# Patient Record
Sex: Female | Born: 1945 | Race: Black or African American | Hispanic: No | Smoking: Current every day smoker
Health system: Southern US, Community
[De-identification: ages and names within clinical notes are randomized; demographics above are authoritative.]

## PROBLEM LIST (undated history)

## (undated) DIAGNOSIS — B019 Varicella without complication: Secondary | ICD-10-CM

## (undated) DIAGNOSIS — K859 Acute pancreatitis without necrosis or infection, unspecified: Secondary | ICD-10-CM

## (undated) DIAGNOSIS — E785 Hyperlipidemia, unspecified: Secondary | ICD-10-CM

## (undated) DIAGNOSIS — K219 Gastro-esophageal reflux disease without esophagitis: Secondary | ICD-10-CM

## (undated) DIAGNOSIS — I10 Essential (primary) hypertension: Secondary | ICD-10-CM

## (undated) DIAGNOSIS — N200 Calculus of kidney: Secondary | ICD-10-CM

## (undated) DIAGNOSIS — G43909 Migraine, unspecified, not intractable, without status migrainosus: Secondary | ICD-10-CM

## (undated) HISTORY — DX: Migraine, unspecified, not intractable, without status migrainosus: G43.909

## (undated) HISTORY — DX: Varicella without complication: B01.9

## (undated) HISTORY — DX: Gastro-esophageal reflux disease without esophagitis: K21.9

## (undated) HISTORY — DX: Hyperlipidemia, unspecified: E78.5

## (undated) HISTORY — DX: Calculus of kidney: N20.0

## (undated) HISTORY — PX: FOOT SURGERY: SHX648

## (undated) HISTORY — PX: ABDOMINAL HYSTERECTOMY: SHX81

## (undated) HISTORY — DX: Acute pancreatitis without necrosis or infection, unspecified: K85.90

## (undated) HISTORY — PX: TUBAL LIGATION: SHX77

## (undated) HISTORY — DX: Essential (primary) hypertension: I10

## (undated) HISTORY — PX: APPENDECTOMY: SHX54

---

## 2004-07-31 ENCOUNTER — Ambulatory Visit: Payer: Self-pay | Admitting: Family Medicine

## 2005-08-05 ENCOUNTER — Ambulatory Visit: Payer: Self-pay | Admitting: Family Medicine

## 2006-03-31 ENCOUNTER — Ambulatory Visit: Payer: Self-pay | Admitting: Family Medicine

## 2006-09-02 ENCOUNTER — Ambulatory Visit: Payer: Self-pay | Admitting: Family Medicine

## 2007-02-18 LAB — HM PAP SMEAR: HM Pap smear: NORMAL

## 2007-02-18 LAB — HM MAMMOGRAPHY: HM Mammogram: NORMAL

## 2007-09-03 ENCOUNTER — Ambulatory Visit: Payer: Self-pay | Admitting: Family Medicine

## 2009-01-18 ENCOUNTER — Ambulatory Visit: Payer: Self-pay

## 2010-03-22 ENCOUNTER — Other Ambulatory Visit: Payer: Self-pay | Admitting: Internal Medicine

## 2010-03-28 ENCOUNTER — Ambulatory Visit: Payer: Self-pay | Admitting: Cardiovascular Disease

## 2010-03-28 DIAGNOSIS — R079 Chest pain, unspecified: Secondary | ICD-10-CM | POA: Insufficient documentation

## 2010-03-31 ENCOUNTER — Telehealth: Payer: Self-pay | Admitting: Physician Assistant

## 2010-03-31 ENCOUNTER — Emergency Department: Payer: Self-pay | Admitting: Unknown Physician Specialty

## 2010-04-02 ENCOUNTER — Encounter: Payer: Self-pay | Admitting: Cardiovascular Disease

## 2010-04-04 ENCOUNTER — Ambulatory Visit: Payer: Self-pay | Admitting: Cardiovascular Disease

## 2010-11-13 NOTE — Miscellaneous (Signed)
  Clinical Lists Changes  Orders: Added new Referral order of Treadmill (Treadmill) - Signed  Appended Document:  Called pt aware of appointment @ [facility] wed june 22 be there at 7:30 eat a lite breakfast.

## 2010-11-13 NOTE — Letter (Signed)
Summary: PHI  PHI   Imported By: Harlon Flor 03/28/2010 16:06:57  _____________________________________________________________________  External Attachment:    Type:   Image     Comment:   External Document

## 2010-11-13 NOTE — Assessment & Plan Note (Signed)
Summary: NP6/AMD   Visit Type:  Initial Consult Primary Provider:  Yates Decamp, M.D.  CC:  Left side chest pain.  History of Present Illness: 65 -year-old woman with a history of borderline diabetes, hypertension, hyperlipidemia, long smoking history, seen by Dr. Dan Humphreys, presenting by referral for evaluation of chest discomfort.  She reports that approximately 2 weeks ago she started having some chest discomfort after she smoked a cigarette. She was typically at rest, with pain lasting approximately one hour. Prior to that she had been walking on treadmill with no significant chest discomfort. Since she has on her smoking, she's had no further episodes of chest pain. She has been active, shopping, walking around at relatively brisk pace is with no symptoms of shortness of breath or chest discomfort.  EKG shows normal sinus rhythm with rate 68 beats per minute, no significant ST or T wave changes.  LDL 98, total cholesterol 161, HDL 44, triglycerides 120. from 03/22/2010  Preventive Screening-Counseling & Management  Alcohol-Tobacco     Smoking Status: current  Current Medications (verified): 1)  Lovastatin 40 Mg Tabs (Lovastatin) .... Take One Tablet By Mouth Daily At Bedtime 2)  Naproxen 500 Mg Tabs (Naproxen) .Marland Kitchen.. 1 Two Times A Day 3)  Quinapril-Hydrochlorothiazide 10-12.5 Mg Tabs (Quinapril-Hydrochlorothiazide) .Marland Kitchen.. 1 Once Daily 4)  Polyethylene Glycol 3350  Liqd (Polyethylene Glycol 3350) .Marland Kitchen.. 1 Once Daily 5)  Aspirin 81 Mg Tbec (Aspirin) .... Take One Tablet By Mouth Daily 6)  Hydrochlorothiazide 25 Mg Tabs (Hydrochlorothiazide) .... Take One Tablet By Mouth Daily. 7)  Fish Oil   Oil (Fish Oil) .Marland Kitchen.. 1 Once Daily 8)  Dexilant 60 Mg Cpdr (Dexlansoprazole) .Marland Kitchen.. 1 Once Daily  Allergies (verified): No Known Drug Allergies  Past History:  Past Medical History: Last updated: 03/28/2010 Hyperlipidemia Hypertension GERD  Past Surgical History: Last updated:  03/28/2010 appendectomy hysterectomy foot surg.  Family History: Last updated: 03/28/2010 Family History of Cancer: Colon cancer (father) Family History of Hyperlipidemia: Brother & Sister Family History of Hypertension: Mother  Social History: Last updated: 03/28/2010 Part Time  Single  Tobacco Use - Yes. 1/3 ppd x 36 yrs.  Risk Factors: Smoking Status: current (03/28/2010)  Social History: Part Time  Single  Tobacco Use - Yes. 1/3 ppd x 36 yrs. Smoking Status:  current  Review of Systems  The patient denies fever, weight loss, weight gain, vision loss, decreased hearing, hoarseness, chest pain, syncope, dyspnea on exertion, peripheral edema, prolonged cough, abdominal pain, incontinence, muscle weakness, depression, and enlarged lymph nodes.         chest pain after smoking 2 weeks ago, none since  Vital Signs:  Patient profile:   65 year old female Height:      63 inches Weight:      233 pounds BMI:     41.42 Pulse rate:   68 / minute BP sitting:   142 / 80  (left arm) Cuff size:   large  Vitals Entered By: Bishop Dublin, CMA (March 28, 2010 2:01 PM)  Physical Exam  General:  Well developed, well nourished, in no acute distress. Head:  normocephalic and atraumatic Neck:  Neck supple, no JVD. No masses, thyromegaly or abnormal cervical nodes. Lungs:  Clear bilaterally to auscultation and percussion. Heart:  Non-displaced PMI, chest non-tender; regular rate and rhythm, S1, S2 without murmurs, rubs or gallops. Carotid upstroke normal, no bruit.  Pedals normal pulses. No edema, no varicosities. Abdomen:  Bowel sounds positive; abdomen soft and non-tender without masses, obese Msk:  Back normal, normal gait. Muscle strength and tone normal. Pulses:  pulses normal in all 4 extremities Extremities:  No clubbing or cyanosis. Neurologic:  Alert and oriented x 3. Skin:  Intact without lesions or rashes. Psych:  Normal affect.    Impression &  Recommendations:  Problem # 1:  CHEST PAIN-UNSPECIFIED (ICD-786.50) etiology of her chest pain is uncertain. It has resolved and she's had none in the past 2 weeks. She reports it only came on after smoking. This certainly raises the possibility of coronary spasm possibly in the setting of underlying coronary artery disease. She currently has no symptoms. Her EKG is normal. She would likely only qualify for a routine treadmill test given that there is no EKG change or symptoms. We talked with her about performing either a treadmill study or a treadmill Myoview. She would like to hold off for now as she is feeling better. She attributes the symptoms to smoking and is very excited that she has cut way down.  I suggested to her that we watch her closely. She certainly has risk factors, largest of which is her long smoking history. I've insisted that she needs to continue to work on smoking cessation. She is on excellent medications for blood pressure and lipid control.  If she has any further episodes of chest discomfort, particularly with exertion, have asked her to contact me immediately and we will arrange a stress test. Again she does not want to schedule one at this time as she is feeling well.  Her updated medication list for this problem includes:    Quinapril-hydrochlorothiazide 10-12.5 Mg Tabs (Quinapril-hydrochlorothiazide) .Marland Kitchen... 1 once daily    Aspirin 81 Mg Tbec (Aspirin) .Marland Kitchen... Take one tablet by mouth daily  Patient Instructions: 1)  Your physician wants you to follow-up AS NEEDED

## 2010-11-13 NOTE — Progress Notes (Signed)
  Phone Note Call from Patient Call back at Home Phone [phone]   Caller: Patient Action Taken: Phone Call Completed, Patient advised to go to ER Summary of Call: Calls with complaints of chest pain.  Started this am after walking outside.  Started after stopping.  Located substernal.  No radiation.  No sob.  Some nausea relieved with BM.  No syncope.  No diaphoresis.  Still having pain.  Started at 8 am.   PCP gave her Dexilant.  Took today, but had taken before chest pain started.    With ongoing pain and significant CRFs, rec. she go to the closest ED at [facility]. Advised her to use EMS.  She would like her son to drive her due to her fixed income.   Initial call taken by: Tereso Newcomer PA-C,  March 31, 2010 1:25 PM     Appended Document:  Several risk factors...could do stress test in [place] or cardiac cath at [facility]. Please call patient on Monday   Appended Document:  scheduled for ETT @[facility]  June 22 at 8 am

## 2011-02-07 ENCOUNTER — Other Ambulatory Visit: Payer: Self-pay | Admitting: Internal Medicine

## 2011-02-08 ENCOUNTER — Ambulatory Visit: Payer: Self-pay | Admitting: Internal Medicine

## 2011-02-15 ENCOUNTER — Ambulatory Visit: Payer: Self-pay | Admitting: Internal Medicine

## 2011-04-30 ENCOUNTER — Encounter: Payer: Self-pay | Admitting: Cardiovascular Disease

## 2011-08-15 ENCOUNTER — Other Ambulatory Visit: Payer: Self-pay | Admitting: Internal Medicine

## 2011-09-30 ENCOUNTER — Other Ambulatory Visit: Payer: Self-pay | Admitting: *Deleted

## 2011-09-30 MED ORDER — LOVASTATIN 40 MG PO TABS: 40.0000 mg | ORAL_TABLET | Freq: Every day | ORAL | Status: DC

## 2011-11-19 ENCOUNTER — Other Ambulatory Visit: Payer: Self-pay | Admitting: *Deleted

## 2011-11-19 MED ORDER — LISINOPRIL-HYDROCHLOROTHIAZIDE 20-12.5 MG PO TABS: 1.0000 | ORAL_TABLET | Freq: Every day | ORAL | Status: DC

## 2011-12-25 ENCOUNTER — Other Ambulatory Visit: Payer: Self-pay | Admitting: *Deleted

## 2011-12-25 MED ORDER — LOVASTATIN 40 MG PO TABS: 40.0000 mg | ORAL_TABLET | Freq: Every day | ORAL | Status: DC

## 2011-12-25 MED ORDER — LISINOPRIL-HYDROCHLOROTHIAZIDE 20-12.5 MG PO TABS: 1.0000 | ORAL_TABLET | Freq: Every day | ORAL | Status: DC

## 2012-01-29 ENCOUNTER — Other Ambulatory Visit: Payer: Self-pay | Admitting: *Deleted

## 2012-01-29 MED ORDER — LISINOPRIL-HYDROCHLOROTHIAZIDE 20-12.5 MG PO TABS: 1.0000 | ORAL_TABLET | Freq: Every day | ORAL | Status: DC

## 2012-02-13 ENCOUNTER — Encounter: Payer: Self-pay | Admitting: Internal Medicine

## 2012-02-18 ENCOUNTER — Ambulatory Visit (INDEPENDENT_AMBULATORY_CARE_PROVIDER_SITE_OTHER): Payer: Self-pay | Admitting: Internal Medicine

## 2012-02-18 ENCOUNTER — Encounter: Payer: Self-pay | Admitting: Internal Medicine

## 2012-02-18 VITALS — BP 140/86 | HR 94 | Temp 98.6°F | Ht 64.0 in | Wt 231.5 lb

## 2012-02-18 DIAGNOSIS — K5900 Constipation, unspecified: Secondary | ICD-10-CM | POA: Insufficient documentation

## 2012-02-18 DIAGNOSIS — I10 Essential (primary) hypertension: Secondary | ICD-10-CM | POA: Insufficient documentation

## 2012-02-18 DIAGNOSIS — R42 Dizziness and giddiness: Secondary | ICD-10-CM | POA: Insufficient documentation

## 2012-02-18 DIAGNOSIS — E785 Hyperlipidemia, unspecified: Secondary | ICD-10-CM

## 2012-02-18 LAB — CBC WITH DIFFERENTIAL/PLATELET
Eosinophils Relative: 1.2 % (ref 0.0–5.0)
HCT: 39.3 % (ref 36.0–46.0)
Hemoglobin: 13.4 g/dL (ref 12.0–15.0)
Lymphocytes Relative: 42 % (ref 12.0–46.0)
Lymphs Abs: 3.3 10*3/uL (ref 0.7–4.0)
Monocytes Relative: 5.8 % (ref 3.0–12.0)
Neutro Abs: 3.9 10*3/uL (ref 1.4–7.7)
Platelets: 242 10*3/uL (ref 150.0–400.0)
WBC: 7.8 10*3/uL (ref 4.5–10.5)

## 2012-02-18 LAB — LIPID PANEL
HDL: 57.5 mg/dL (ref >39.00)
Total CHOL/HDL Ratio: 4
VLDL: 23.8 mg/dL (ref 0.0–40.0)

## 2012-02-18 LAB — COMPREHENSIVE METABOLIC PANEL
CO2: 26 mEq/L (ref 19–32)
Calcium: 9.7 mg/dL (ref 8.4–10.5)
Creatinine, Ser: 0.9 mg/dL (ref 0.4–1.2)
GFR: 80.2 mL/min (ref >60.00)
Glucose, Bld: 100 mg/dL — ABNORMAL HIGH (ref 70–99)
Total Bilirubin: 0.4 mg/dL (ref 0.3–1.2)

## 2012-02-18 MED ORDER — LISINOPRIL-HYDROCHLOROTHIAZIDE 20-12.5 MG PO TABS
1.0000 | ORAL_TABLET | Freq: Every day | ORAL | Status: DC
Start: 2012-02-18 — End: 2012-06-09

## 2012-02-18 MED ORDER — POLYETHYLENE GLYCOL 3350 GRAN: 17.0000 g | GRANULES | Freq: Every day | Status: DC

## 2012-02-18 MED ORDER — LOVASTATIN 40 MG PO TABS: 40.0000 mg | ORAL_TABLET | Freq: Every day | ORAL | Status: DC

## 2012-02-18 NOTE — Assessment & Plan Note (Addendum)
BP slightly above goal today. Will check renal function with labs. Will monitor BP and plan to adjust medication if consistently >140/90. Follow up 6 months, or sooner if pt establishes insurance (she is concerned about cost).

## 2012-02-18 NOTE — Progress Notes (Signed)
Subjective:    Patient ID: Jillian Mccullough, female    DOB: Nov 21, 1945, 66 y.o.   MRN: [id number]  HPI 66 year old female with history of hypertension, hyperlipidemia, and GERD presents for followup. She reports that she is generally doing well. She reports full compliance with her medications. She is concerned about a couple episodes of lightheadedness, and one episode of syncope over the last year. She notes that all of these events occurred in the setting of her consuming alcohol and marijuana. She denies any chest pain, shortness of breath, palpitations, or focal neurologic symptoms. She has never had an episode when not using alcohol or marijuana. She reports that she is generally feeling well. She has a good appetite. She has a normal energy level.  She also notes that she has some muscular pain in her right chest wall. This is worsened with movement. It has been present for a couple of days. She is concerned that she may have pulled a muscle while twisting to lift something. She has not taking any medication for this.  Outpatient Encounter Prescriptions as of 02/18/2012  Medication Sig Dispense Refill  . aspirin 81 MG EC tablet Take 81 mg by mouth daily.        . Fish Oil OIL Take 1 tablet by mouth daily.        Marland Kitchen lisinopril-hydrochlorothiazide (PRINZIDE,ZESTORETIC) 20-12.5 MG per tablet Take 1 tablet by mouth daily.  90 tablet  3  . lovastatin (MEVACOR) 40 MG tablet Take 1 tablet (40 mg total) by mouth at bedtime.  90 tablet  3  . omeprazole (PRILOSEC) 40 MG capsule Take 40 mg by mouth daily.      . Polyethylene Glycol 3350 GRAN Take 17 g by mouth daily. 1 once daily.  1 g  3   BP 140/86  Pulse 94  Temp(Src) 98.6 F (37 C) (Oral)  Ht 5\' 4"  (1.626 m)  Wt 231 lb 8 oz (105.008 kg)  BMI 39.74 kg/m2  SpO2 99%  Review of Systems  Constitutional: Negative for fever, chills, appetite change, fatigue and unexpected weight change.  HENT: Negative for ear pain, congestion, sore throat, trouble  swallowing, neck pain, voice change and sinus pressure.   Eyes: Negative for visual disturbance.  Respiratory: Negative for cough, shortness of breath, wheezing and stridor.   Cardiovascular: Negative for chest pain, palpitations and leg swelling.  Gastrointestinal: Negative for nausea, vomiting, abdominal pain, diarrhea, constipation, blood in stool, abdominal distention and anal bleeding.  Genitourinary: Negative for dysuria and flank pain.  Musculoskeletal: Positive for myalgias. Negative for arthralgias and gait problem.  Skin: Negative for color change and rash.  Neurological: Positive for syncope and light-headedness. Negative for dizziness, tremors, seizures, facial asymmetry, speech difficulty, weakness, numbness and headaches.  Hematological: Negative for adenopathy. Does not bruise/bleed easily.  Psychiatric/Behavioral: Negative for suicidal ideas, sleep disturbance and dysphoric mood. The patient is not nervous/anxious.        Objective:   Physical Exam  Constitutional: She is oriented to person, place, and time. She appears well-developed and well-nourished. No distress.  HENT:  Head: Normocephalic and atraumatic.  Right Ear: External ear normal.  Left Ear: External ear normal.  Nose: Nose normal.  Mouth/Throat: Oropharynx is clear and moist. No oropharyngeal exudate.  Eyes: Conjunctivae are normal. Pupils are equal, round, and reactive to light. Right eye exhibits no discharge. Left eye exhibits no discharge. No scleral icterus.  Neck: Normal range of motion. Neck supple. No tracheal deviation present. No thyromegaly present.  Cardiovascular: Normal rate, regular rhythm, normal heart sounds and intact distal pulses.  Exam reveals no gallop and no friction rub.   No murmur heard. Pulmonary/Chest: Effort normal and breath sounds normal. No respiratory distress. She has no wheezes. She has no rales. She exhibits no tenderness.  Abdominal: Soft. Bowel sounds are normal. She  exhibits no distension and no mass. There is no tenderness. There is no guarding.  Musculoskeletal: Normal range of motion. She exhibits no edema and no tenderness.  Lymphadenopathy:    She has no cervical adenopathy.  Neurological: She is alert and oriented to person, place, and time. No cranial nerve deficit. She exhibits normal muscle tone. Coordination normal.  Skin: Skin is warm and dry. No rash noted. She is not diaphoretic. No erythema. No pallor.  Psychiatric: She has a normal mood and affect. Her behavior is normal. Judgment and thought content normal.          Assessment & Plan:

## 2012-02-18 NOTE — Assessment & Plan Note (Signed)
Symptoms well controlled with Miralax. Will continue.

## 2012-02-18 NOTE — Assessment & Plan Note (Signed)
Will check lipids and LFTs with labs today. Goal LDL<100. Continue statin. Follow up 6 months.

## 2012-02-18 NOTE — Assessment & Plan Note (Signed)
Likely secondary to use of ETOH and marijuana. Encouraged pt to limit or abstain from use of both. She will call if symptoms recur.

## 2012-03-19 ENCOUNTER — Encounter: Payer: Self-pay | Admitting: Internal Medicine

## 2012-03-26 ENCOUNTER — Encounter: Payer: Self-pay | Admitting: Internal Medicine

## 2012-05-24 ENCOUNTER — Emergency Department: Payer: Self-pay | Admitting: Emergency Medicine

## 2012-05-25 LAB — COMPREHENSIVE METABOLIC PANEL
Albumin: 3.4 g/dL (ref 3.4–5.0)
Anion Gap: 10 (ref 7–16)
BUN: 13 mg/dL (ref 7–18)
Bilirubin,Total: 0.2 mg/dL (ref 0.2–1.0)
Calcium, Total: 9.4 mg/dL (ref 8.5–10.1)
Co2: 25 mmol/L (ref 21–32)
Creatinine: 0.76 mg/dL (ref 0.60–1.30)
EGFR (Non-African Amer.): >60
Glucose: 119 mg/dL — ABNORMAL HIGH (ref 65–99)
Osmolality: 283 (ref 275–301)
Potassium: 3.5 mmol/L (ref 3.5–5.1)

## 2012-05-25 LAB — CBC
HCT: 38.9 % (ref 35.0–47.0)
HGB: 13.4 g/dL (ref 12.0–16.0)
MCH: 31 pg (ref 26.0–34.0)
MCV: 90 fL (ref 80–100)
RBC: 4.31 10*6/uL (ref 3.80–5.20)
WBC: 8.4 10*3/uL (ref 3.6–11.0)

## 2012-05-25 LAB — URINALYSIS, COMPLETE
Bilirubin,UR: NEGATIVE
Leukocyte Esterase: NEGATIVE
Nitrite: NEGATIVE
Ph: 5 (ref 4.5–8.0)
Protein: NEGATIVE
RBC,UR: 3 /HPF (ref 0–5)
Specific Gravity: 1.014 (ref 1.003–1.030)

## 2012-05-25 LAB — LIPASE, BLOOD: Lipase: 417 U/L — ABNORMAL HIGH (ref 73–393)

## 2012-06-09 ENCOUNTER — Telehealth: Payer: Self-pay | Admitting: *Deleted

## 2012-06-09 NOTE — Telephone Encounter (Signed)
Patient called stating that she spent the night in the hospital [facility]) about a week ago due to a flare up with her pancreas.  She was told that her Lisinopril-HCTZ should be changed because it is causing these flares.  They changed her to Lisinopril 20mg  without the Hydrochlorothiazide.  Hospital follow up appt scheduled for 06/23/2012.

## 2012-06-09 NOTE — Telephone Encounter (Signed)
That is fine. Can we request records on her hospitalization? Did she say that uric acid level was high, causing gallstones leading to pancreatitis?

## 2012-06-11 NOTE — Telephone Encounter (Signed)
Request notes from [facility]

## 2012-06-23 ENCOUNTER — Ambulatory Visit (INDEPENDENT_AMBULATORY_CARE_PROVIDER_SITE_OTHER): Payer: Self-pay | Admitting: Internal Medicine

## 2012-06-23 ENCOUNTER — Encounter: Payer: Self-pay | Admitting: Internal Medicine

## 2012-06-23 ENCOUNTER — Other Ambulatory Visit: Payer: Self-pay | Admitting: *Deleted

## 2012-06-23 ENCOUNTER — Encounter: Payer: Self-pay | Admitting: *Deleted

## 2012-06-23 VITALS — BP 140/82 | HR 80 | Temp 98.2°F | Ht 64.0 in | Wt 237.8 lb

## 2012-06-23 DIAGNOSIS — Z23 Encounter for immunization: Secondary | ICD-10-CM

## 2012-06-23 DIAGNOSIS — R82998 Other abnormal findings in urine: Secondary | ICD-10-CM

## 2012-06-23 DIAGNOSIS — R8290 Unspecified abnormal findings in urine: Secondary | ICD-10-CM | POA: Insufficient documentation

## 2012-06-23 DIAGNOSIS — E785 Hyperlipidemia, unspecified: Secondary | ICD-10-CM

## 2012-06-23 DIAGNOSIS — N390 Urinary tract infection, site not specified: Secondary | ICD-10-CM

## 2012-06-23 DIAGNOSIS — K859 Acute pancreatitis without necrosis or infection, unspecified: Secondary | ICD-10-CM | POA: Insufficient documentation

## 2012-06-23 DIAGNOSIS — I10 Essential (primary) hypertension: Secondary | ICD-10-CM

## 2012-06-23 LAB — COMPREHENSIVE METABOLIC PANEL
Albumin: 3.9 g/dL (ref 3.5–5.2)
BUN: 17 mg/dL (ref 6–23)
CO2: 24 mEq/L (ref 19–32)
Calcium: 9.3 mg/dL (ref 8.4–10.5)
Chloride: 107 mEq/L (ref 96–112)
GFR: 81.14 mL/min (ref >60.00)
Glucose, Bld: 102 mg/dL — ABNORMAL HIGH (ref 70–99)
Potassium: 4.2 mEq/L (ref 3.5–5.1)
Sodium: 139 mEq/L (ref 135–145)
Total Protein: 7.5 g/dL (ref 6.0–8.3)

## 2012-06-23 LAB — POCT URINALYSIS DIPSTICK
Ketones, UA: NEGATIVE
Protein, UA: NEGATIVE
Spec Grav, UA: 1.025
pH, UA: 5.5

## 2012-06-23 LAB — LIPID PANEL
Cholesterol: 171 mg/dL (ref 0–200)
VLDL: 22.2 mg/dL (ref 0.0–40.0)

## 2012-06-23 LAB — HEMOGLOBIN A1C: Hgb A1c MFr Bld: 6.1 % (ref 4.6–6.5)

## 2012-06-23 LAB — LIPASE: Lipase: 26 U/L (ref 11.0–59.0)

## 2012-06-23 MED ORDER — CIPROFLOXACIN HCL 250 MG PO TABS: 250.0000 mg | ORAL_TABLET | Freq: Two times a day (BID) | ORAL | Status: AC

## 2012-06-23 MED ORDER — LOVASTATIN 40 MG PO TABS: 40.0000 mg | ORAL_TABLET | Freq: Every day | ORAL | Status: DC

## 2012-06-23 MED ORDER — LISINOPRIL 20 MG PO TABS: 20.0000 mg | ORAL_TABLET | Freq: Every day | ORAL | Status: DC

## 2012-06-23 NOTE — Assessment & Plan Note (Signed)
Patient reports foul smell to her urine. Urinalysis is markable for blood. Will send urine for culture. Will treat empirically for urinary tract infection with Cipro. Patient will call if symptoms are not improving.

## 2012-06-23 NOTE — Addendum Note (Signed)
Addended by: Gilmer Mor on: 06/23/2012 01:51 PM   Modules accepted: Orders

## 2012-06-23 NOTE — Assessment & Plan Note (Signed)
Last LDL slightly elevated. Will repeat lipids and LFTs today.

## 2012-06-23 NOTE — Assessment & Plan Note (Signed)
Patient status post recent hospitalization for pancreatitis. Symptoms of nausea and abdominal pain have subsided. Will request records from hospitalization. Will recheck electrolytes and lipase with labs today. Followup one month.

## 2012-06-23 NOTE — Assessment & Plan Note (Signed)
BP slightly high today. HCTZ recently stopped during hospitalization because of concern about increasing risk of pancreatitis. Discussed monitoring BP 1-2 times per week and calling if BP >140/90. If BP consistently elevated, would favor increasing lisinopril to 30mg  daily. Follow up 2 months.

## 2012-06-23 NOTE — Progress Notes (Signed)
Subjective:    Patient ID: Jillian Mccullough, female    DOB: [birth year], 66 y.o.   MRN: [id number]  HPI  66 year old female with history of hypertension, hyperlipidemia, obesity, and recurrent pancreatitis presents for followup after recent hospitalization for pancreatitis. She reports that she developed severe upper abdominal pain and nausea and ultimately went to the ER for evaluation. She was diagnosed with pancreatitis. She reports that she underwent CT of the abdomen which was normal. She was instructed by the physician during her admission to stop her hydrochlorothiazide because she was told this medication might contribute to pancreatitis. She reports her blood pressures have been well-controlled lisinopril alone, typically near 120/80. She denies any chest pain, palpitations, headache.   She is concerned about some foul smell to her urine. This is been present for several weeks. She denies any dysuria, hematuria, flank pain, fever, chills, urinary urgency or frequency. She denies vaginal discharge.  Outpatient Encounter Prescriptions as of 06/23/2012  Medication Sig Dispense Refill  . aspirin 81 MG EC tablet Take 81 mg by mouth daily.        . Fish Oil OIL Take 1 tablet by mouth daily.        Marland Kitchen lisinopril (PRINIVIL,ZESTRIL) 20 MG tablet Take 1 tablet (20 mg total) by mouth daily.  90 tablet  3  . lovastatin (MEVACOR) 40 MG tablet Take 1 tablet (40 mg total) by mouth at bedtime.  90 tablet  3  . omeprazole (PRILOSEC) 40 MG capsule Take 40 mg by mouth daily.      . Polyethylene Glycol 3350 GRAN Take 17 g by mouth daily. 1 once daily.  1 g  3  . ciprofloxacin (CIPRO) 250 MG tablet Take 1 tablet (250 mg total) by mouth 2 (two) times daily.  10 tablet  0   BP 140/82  Pulse 80  Temp 98.2 F (36.8 C) (Oral)  Ht 5\' 4"  (1.626 m)  Wt 237 lb 12 oz (107.843 kg)  BMI 40.81 kg/m2  SpO2 98%  Review of Systems  Constitutional: Negative for fever, chills, appetite change, fatigue and unexpected weight  change.  HENT: Negative for ear pain, congestion, sore throat, trouble swallowing, neck pain, voice change and sinus pressure.   Eyes: Negative for visual disturbance.  Respiratory: Negative for cough, shortness of breath, wheezing and stridor.   Cardiovascular: Negative for chest pain, palpitations and leg swelling.  Gastrointestinal: Negative for nausea, vomiting, abdominal pain, diarrhea, constipation, blood in stool, abdominal distention and anal bleeding.  Genitourinary: Positive for dysuria. Negative for urgency, frequency, hematuria, flank pain, decreased urine volume and pelvic pain.  Musculoskeletal: Negative for myalgias, arthralgias and gait problem.  Skin: Negative for color change and rash.  Neurological: Negative for dizziness and headaches.  Hematological: Negative for adenopathy. Does not bruise/bleed easily.  Psychiatric/Behavioral: Negative for suicidal ideas, disturbed wake/sleep cycle and dysphoric mood. The patient is not nervous/anxious.        Objective:   Physical Exam  Constitutional: She is oriented to person, place, and time. She appears well-developed and well-nourished. No distress.  HENT:  Head: Normocephalic and atraumatic.  Right Ear: External ear normal.  Left Ear: External ear normal.  Nose: Nose normal.  Mouth/Throat: Oropharynx is clear and moist. No oropharyngeal exudate.  Eyes: Conjunctivae are normal. Pupils are equal, round, and reactive to light. Right eye exhibits no discharge. Left eye exhibits no discharge. No scleral icterus.  Neck: Normal range of motion. Neck supple. No tracheal deviation present. No thyromegaly present.  Cardiovascular:  Normal rate, regular rhythm, normal heart sounds and intact distal pulses.  Exam reveals no gallop and no friction rub.   No murmur heard. Pulmonary/Chest: Effort normal and breath sounds normal. No respiratory distress. She has no wheezes. She has no rales. She exhibits no tenderness.  Abdominal: Soft.  Bowel sounds are normal. She exhibits no distension and no mass. There is tenderness (slight right upper quad). There is no rebound and no guarding.  Musculoskeletal: Normal range of motion. She exhibits no edema and no tenderness.  Lymphadenopathy:    She has no cervical adenopathy.  Neurological: She is alert and oriented to person, place, and time. No cranial nerve deficit. She exhibits normal muscle tone. Coordination normal.  Skin: Skin is warm and dry. No rash noted. She is not diaphoretic. No erythema. No pallor.  Psychiatric: She has a normal mood and affect. Her behavior is normal. Judgment and thought content normal.          Assessment & Plan:

## 2012-06-25 LAB — URINE CULTURE

## 2012-06-26 ENCOUNTER — Other Ambulatory Visit (INDEPENDENT_AMBULATORY_CARE_PROVIDER_SITE_OTHER): Payer: Self-pay

## 2012-06-26 DIAGNOSIS — N390 Urinary tract infection, site not specified: Secondary | ICD-10-CM

## 2012-06-29 ENCOUNTER — Encounter: Payer: Self-pay | Admitting: Internal Medicine

## 2012-06-29 LAB — URINE CULTURE

## 2012-06-30 ENCOUNTER — Other Ambulatory Visit: Payer: Self-pay | Admitting: *Deleted

## 2012-06-30 MED ORDER — NITROFURANTOIN MONOHYD MACRO 100 MG PO CAPS: 100.0000 mg | ORAL_CAPSULE | Freq: Two times a day (BID) | ORAL | Status: DC

## 2012-06-30 NOTE — Progress Notes (Signed)
Per Cx results/SLS

## 2012-08-26 ENCOUNTER — Ambulatory Visit: Payer: Self-pay | Admitting: Internal Medicine

## 2012-09-30 ENCOUNTER — Ambulatory Visit: Payer: Self-pay

## 2013-06-10 ENCOUNTER — Other Ambulatory Visit: Payer: Self-pay | Admitting: Internal Medicine

## 2013-08-21 ENCOUNTER — Other Ambulatory Visit: Payer: Self-pay | Admitting: Internal Medicine

## 2013-10-21 ENCOUNTER — Ambulatory Visit: Payer: Self-pay | Admitting: Family Medicine

## 2014-05-05 ENCOUNTER — Ambulatory Visit: Payer: Self-pay | Admitting: Family Medicine

## 2014-05-14 ENCOUNTER — Ambulatory Visit: Payer: Self-pay | Admitting: Family Medicine

## 2014-06-14 ENCOUNTER — Ambulatory Visit: Payer: Self-pay | Admitting: Family Medicine

## 2014-08-15 ENCOUNTER — Ambulatory Visit (INDEPENDENT_AMBULATORY_CARE_PROVIDER_SITE_OTHER): Payer: Medicare HMO

## 2014-08-15 ENCOUNTER — Telehealth: Payer: Self-pay | Admitting: *Deleted

## 2014-08-15 ENCOUNTER — Encounter: Payer: Self-pay | Admitting: Podiatry

## 2014-08-15 ENCOUNTER — Ambulatory Visit (INDEPENDENT_AMBULATORY_CARE_PROVIDER_SITE_OTHER): Payer: Medicare HMO | Admitting: Podiatry

## 2014-08-15 VITALS — BP 141/70 | HR 87 | Resp 16 | Ht 67.0 in | Wt 241.0 lb

## 2014-08-15 DIAGNOSIS — M2011 Hallux valgus (acquired), right foot: Secondary | ICD-10-CM

## 2014-08-15 DIAGNOSIS — M21611 Bunion of right foot: Secondary | ICD-10-CM

## 2014-08-15 DIAGNOSIS — Q828 Other specified congenital malformations of skin: Secondary | ICD-10-CM

## 2014-08-15 DIAGNOSIS — E119 Type 2 diabetes mellitus without complications: Secondary | ICD-10-CM

## 2014-08-15 NOTE — Progress Notes (Signed)
   Subjective:    Patient ID: Wallace CullensAlma V Tiggs, female    DOB: 11/17/1945, 68 y.o.   MRN: [id number]  HPI Comments: My primary doctor referred me over here. Not having any trouble with my feet  Diabetic last a1c 6.0  Diabetes      Review of Systems  Endocrine:       Diabetes       Objective:   Physical Exam: I have reviewed her past medical history medications allergies surgery social history and review of systems. Pulses are strongly palpable. Neurologic sensory is intact percent was C monofilament. Deep tendon reflexes are intact bilateral muscle strength is 5 over 5 dorsiflexion plantar flexors and inverters and everters all into the musculature is intact. Orthopedic evaluation was resulted assistant to the angle full range of motion without crepitus mildly to be deformities bilateral right greater than left mild hammertoe deformities with adductor varus rotated hammertoe deformity fifth left greater than right. Radiographic evaluation confirms adductor varus rotated hammertoe deformity left greater than right. No other osseous have her bellies of the mild pes planus noted.        Assessment & Plan:  Assessment: Well-controlled diabetes mellitus with no foot complications. 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  Plan: Debridement of reactive hyperkeratosis fifth digit left foot. No complications.

## 2014-08-15 NOTE — Telephone Encounter (Signed)
I would like to tell you all you have the wrong doctor on this paper.  I didn't see it until I got home and was reading these papers.  Call me back.  Dr. Dan HumphreysWalker is not my doctor no more.

## 2014-10-12 ENCOUNTER — Ambulatory Visit: Payer: Self-pay | Admitting: Nurse Practitioner

## 2014-11-10 ENCOUNTER — Ambulatory Visit: Payer: Self-pay | Admitting: Nurse Practitioner

## 2015-02-13 ENCOUNTER — Ambulatory Visit: Payer: Medicare HMO | Admitting: Podiatry

## 2015-07-25 ENCOUNTER — Emergency Department: Payer: Medicare HMO

## 2015-07-25 ENCOUNTER — Encounter: Payer: Self-pay | Admitting: Emergency Medicine

## 2015-07-25 ENCOUNTER — Emergency Department
Admission: EM | Admit: 2015-07-25 | Discharge: 2015-07-25 | Disposition: A | Discharge status: Home / Self Care | Payer: Medicare HMO | Attending: Emergency Medicine | Admitting: Emergency Medicine

## 2015-07-25 DIAGNOSIS — I10 Essential (primary) hypertension: Secondary | ICD-10-CM | POA: Diagnosis not present

## 2015-07-25 DIAGNOSIS — K2970 Gastritis, unspecified, without bleeding: Secondary | ICD-10-CM | POA: Insufficient documentation

## 2015-07-25 DIAGNOSIS — Z720 Tobacco use: Secondary | ICD-10-CM | POA: Insufficient documentation

## 2015-07-25 DIAGNOSIS — Z7982 Long term (current) use of aspirin: Secondary | ICD-10-CM | POA: Diagnosis not present

## 2015-07-25 DIAGNOSIS — Z79899 Other long term (current) drug therapy: Secondary | ICD-10-CM | POA: Insufficient documentation

## 2015-07-25 DIAGNOSIS — R1011 Right upper quadrant pain: Secondary | ICD-10-CM

## 2015-07-25 LAB — COMPREHENSIVE METABOLIC PANEL
ALBUMIN: 4.2 g/dL (ref 3.5–5.0)
ALT: 16 U/L (ref 14–54)
AST: 19 U/L (ref 15–41)
Alkaline Phosphatase: 48 U/L (ref 38–126)
Anion gap: 5 (ref 5–15)
BUN: 16 mg/dL (ref 6–20)
CHLORIDE: 110 mmol/L (ref 101–111)
CO2: 25 mmol/L (ref 22–32)
Calcium: 9.7 mg/dL (ref 8.9–10.3)
Creatinine, Ser: 0.86 mg/dL (ref 0.44–1.00)
GFR calc Af Amer: >60 mL/min (ref >60)
GFR calc non Af Amer: >60 mL/min (ref >60)
GLUCOSE: 109 mg/dL — AB (ref 65–99)
POTASSIUM: 3.8 mmol/L (ref 3.5–5.1)
Sodium: 140 mmol/L (ref 135–145)
Total Bilirubin: 0.7 mg/dL (ref 0.3–1.2)
Total Protein: 7.8 g/dL (ref 6.5–8.1)

## 2015-07-25 LAB — CBC
HEMATOCRIT: 41.2 % (ref 35.0–47.0)
Hemoglobin: 13.7 g/dL (ref 12.0–16.0)
MCH: 30.2 pg (ref 26.0–34.0)
MCHC: 33.1 g/dL (ref 32.0–36.0)
MCV: 91.2 fL (ref 80.0–100.0)
Platelets: 244 10*3/uL (ref 150–440)
RBC: 4.52 MIL/uL (ref 3.80–5.20)
RDW: 14.4 % (ref 11.5–14.5)
WBC: 7.7 10*3/uL (ref 3.6–11.0)

## 2015-07-25 LAB — LIPASE, BLOOD: LIPASE: 41 U/L (ref 22–51)

## 2015-07-25 LAB — TROPONIN I: Troponin I: <0.03 ng/mL (ref <0.031)

## 2015-07-25 MED ORDER — SUCRALFATE 1 G PO TABS: 1.0000 g | ORAL_TABLET | Freq: Two times a day (BID) | ORAL | Status: DC

## 2015-07-25 MED ORDER — GI COCKTAIL ~~LOC~~
30.0000 mL | Freq: Once | ORAL | Status: AC
  Administered 2015-07-25: 30 mL via ORAL

## 2015-07-25 MED ORDER — MORPHINE SULFATE (PF) 4 MG/ML IV SOLN
INTRAVENOUS | Status: AC
  Administered 2015-07-25: 4 mg via INTRAVENOUS
  Filled 2015-07-25: qty 1

## 2015-07-25 MED ORDER — ONDANSETRON HCL 4 MG/2ML IJ SOLN
INTRAMUSCULAR | Status: AC
  Administered 2015-07-25: 4 mg via INTRAVENOUS
  Filled 2015-07-25: qty 2

## 2015-07-25 MED ORDER — GI COCKTAIL ~~LOC~~
ORAL | Status: AC
  Administered 2015-07-25: 30 mL via ORAL
  Filled 2015-07-25: qty 30

## 2015-07-25 MED ORDER — MORPHINE SULFATE (PF) 4 MG/ML IV SOLN
4.0000 mg | Freq: Once | INTRAVENOUS | Status: AC
  Administered 2015-07-25: 4 mg via INTRAVENOUS

## 2015-07-25 MED ORDER — ONDANSETRON HCL 4 MG/2ML IJ SOLN
4.0000 mg | Freq: Once | INTRAMUSCULAR | Status: AC
  Administered 2015-07-25: 4 mg via INTRAVENOUS

## 2015-07-25 NOTE — ED Provider Notes (Signed)
[facility] Emergency Department Provider Note  ____________________________________________  Time seen: Approximately 439 AM  I have reviewed the triage vital signs and the nursing notes.   HISTORY  Chief Complaint Abdominal Pain    HPI Jillian Mccullough is a 69 y.o. female who comes into the hospital tonight with right upper quadrant pain. The patient reports the pain started 4-5 days ago. She reports though that the pain has continued. She has not been taking anything for the pain and thinks it may be due to her pancreas. The patient reports that it came on suddenly and feels that it may be due to the fact that she had been eating a lot of red meat. The patient endorses nausea with no vomiting. She denies fevers chills chest pain or shortness of breath. She reports her pain as a 7 out of 10 in intensity. The patient reports she is also constipated chronically. The patient reports that since his pain has persisted for the last 2 days she came in for evaluation.   Past Medical History  Diagnosis Date  . Hyperlipidemia   . Hypertension   . GERD (gastroesophageal reflux disease)   . Chicken pox   . Kidney stones   . Migraines   . Pancreatitis     Confirmed by EUS    Patient Active Problem List   Diagnosis Date Noted  . Urine abnormality 06/23/2012  . Pancreatitis 06/23/2012  . Hypertension 02/18/2012  . Hyperlipidemia LDL goal < 100 02/18/2012  . CHEST PAIN-UNSPECIFIED 03/28/2010    Past Surgical History  Procedure Laterality Date  . Appendectomy    . Abdominal hysterectomy    . Foot surgery    . Tubal ligation      Current Outpatient Rx  Name  Route  Sig  Dispense  Refill  . amLODipine (NORVASC) 5 MG tablet   Oral   Take 5 mg by mouth daily.         Marland Kitchen atorvastatin (LIPITOR) 20 MG tablet   Oral   Take 20 mg by mouth daily.         . Fish Oil OIL   Oral   Take 1 tablet by mouth daily.           Marland Kitchen lisinopril (PRINIVIL,ZESTRIL) 40 MG  tablet   Oral   Take 1 tablet by mouth daily.         . Multiple Vitamins-Minerals (CENTRUM SILVER PO)   Oral   Take by mouth.         . polyethylene glycol powder (GLYCOLAX/MIRALAX) powder   Oral   Take 17 g by mouth as needed.         Marland Kitchen aspirin 81 MG EC tablet   Oral   Take 81 mg by mouth daily.           . sucralfate (CARAFATE) 1 G tablet   Oral   Take 1 tablet (1 g total) by mouth 2 (two) times daily.   16 tablet   0     Allergies Hctz  Family History  Problem Relation Age of Onset  . Hypertension Mother   . Cancer Mother     colon and lung  . Colon cancer Father   . Diabetes Father   . Hyperlipidemia Sister   . Diabetes Sister   . Hyperlipidemia Brother   . Arthritis Brother   . Arthritis Sister     Social History Social History  Substance Use Topics  . Smoking  status: Current Every Day Smoker -- 0.33 packs/day for 36 years  . Smokeless tobacco: None  . Alcohol Use: No    Review of Systems Constitutional: No fever/chills Eyes: No visual changes. ENT: No sore throat. Cardiovascular: Denies chest pain. Respiratory: Denies shortness of breath. Gastrointestinal:  abdominal pain.   nausea, no vomiting.  No diarrhea.  No constipation. Genitourinary: Negative for dysuria. Musculoskeletal: Negative for back pain. Skin: Negative for rash. Neurological: Negative for headaches, focal weakness or numbness.  10-point ROS otherwise negative.  ____________________________________________   PHYSICAL EXAM:  VITAL SIGNS: ED Triage Vitals  Enc Vitals Group     BP 07/25/15 0343 159/77 mmHg     Pulse Rate 07/25/15 0343 72     Resp 07/25/15 0343 18     Temp 07/25/15 0343 98 F (36.7 C)     Temp Source 07/25/15 0343 Oral     SpO2 07/25/15 0343 97 %     Weight 07/25/15 0343 238 lb 4.8 oz (108.092 kg)     Height 07/25/15 0343  (1.6 m)     Head Cir --      Peak Flow --      Pain Score 07/25/15 0342 6     Pain Loc --      Pain Edu? --       Excl. in GC? --     Constitutional: Alert and oriented. Well appearing and in moderate distress. Eyes: Conjunctivae are normal. PERRL. EOMI. Head: Atraumatic. Nose: No congestion/rhinnorhea. Mouth/Throat: Mucous membranes are moist.  Oropharynx non-erythematous. Cardiovascular: Normal rate, regular rhythm. Grossly normal heart sounds.  Good peripheral circulation. Respiratory: Normal respiratory effort.  No retractions. Lungs CTAB. Gastrointestinal: Soft and nontender. No distention. Positive bowel sounds Musculoskeletal: No lower extremity tenderness nor edema.   Neurologic:  Normal speech and language.  Skin:  Skin is warm, dry and intact.  Psychiatric: Mood and affect are normal.   ____________________________________________   LABS (all labs ordered are listed, but only abnormal results are displayed)  Labs Reviewed  COMPREHENSIVE METABOLIC PANEL - Abnormal; Notable for the following:    Glucose, Bld 109 (*)    All other components within normal limits  LIPASE, BLOOD  CBC  TROPONIN I  URINALYSIS COMPLETEWITH MICROSCOPIC ([facility] ONLY)   ____________________________________________  EKG  ED ECG REPORT I, Rebecka Apley, the attending physician, personally viewed and interpreted this ECG.   Date: 07/25/2015  EKG Time: 403  Rate: 67  Rhythm: normal sinus rhythm  Axis: Normal  Intervals:none  ST&T Change: None  ____________________________________________  RADIOLOGY  Right upper quadrant ultrasound: Normal appearance of the visualized gallbladder, no evidence of cholelithiasis or cholecystitis, diffuse fatty infiltration of the liver ____________________________________________   PROCEDURES  Procedure(s) performed: None  Critical Care performed: No  ____________________________________________   INITIAL IMPRESSION / ASSESSMENT AND PLAN / ED COURSE  Pertinent labs & imaging results that were available during my care of the patient were reviewed by me  and considered in my medical decision making (see chart for details).  This is a 69 year old female who comes in today with some right upper quadrant pain. The patient reports that has been going on for multiple days. The patient's blood work is unremarkable. Her troponin shows no signs of increased, her EKG is unremarkable and her ultrasound is also negative. The patient has not vomited and does not have pancreatitis. Since the patient's ultrasound is unremarkable I will discharge her to home and have her follow-up with her primary care  physician. The patient was given some morphine as well as a GI cocktail and it did improve with the GI cocktail. I will treat the patient for gastritis and again have her follow-up with her primary care physician. ____________________________________________   FINAL CLINICAL IMPRESSION(S) / ED DIAGNOSES  Final diagnoses:  RUQ pain  Gastritis      Rebecka Apley, MD 07/25/15 [phone]

## 2015-07-25 NOTE — ED Notes (Signed)
Patient ambulatory to triage with steady gait, without difficulty or distress noted; pt reports pain to right sided abd pain radiating into back since Friday; st hx pancreatitis

## 2015-07-25 NOTE — Discharge Instructions (Signed)
Abdominal Pain, Adult °Many things can cause abdominal pain. Usually, abdominal pain is not caused by a disease and will improve without treatment. It can often be observed and treated at home. Your health care provider will do a physical exam and possibly order blood tests and X-rays to help determine the seriousness of your pain. However, in many cases, more time must pass before a clear cause of the pain can be found. Before that point, your health care provider may not know if you need more testing or further treatment. °HOME CARE INSTRUCTIONS °Monitor your abdominal pain for any changes. The following actions may help to alleviate any discomfort you are experiencing: °· Only take over-the-counter or prescription medicines as directed by your health care provider. °· Do not take laxatives unless directed to do so by your health care provider. °· Try a clear liquid diet (broth, tea, or water) as directed by your health care provider. Slowly move to a bland diet as tolerated. °SEEK MEDICAL CARE IF: °· You have unexplained abdominal pain. °· You have abdominal pain associated with nausea or diarrhea. °· You have pain when you urinate or have a bowel movement. °· You experience abdominal pain that wakes you in the night. °· You have abdominal pain that is worsened or improved by eating food. °· You have abdominal pain that is worsened with eating fatty foods. °· You have a fever. °SEEK IMMEDIATE MEDICAL CARE IF: °· Your pain does not go away within 2 hours. °· You keep throwing up (vomiting). °· Your pain is felt only in portions of the abdomen, such as the right side or the left lower portion of the abdomen. °· You pass bloody or black tarry stools. °MAKE SURE YOU: °· Understand these instructions. °· Will watch your condition. °· Will get help right away if you are not doing well or get worse. °  °This information is not intended to replace advice given to you by your health care provider. Make sure you discuss  any questions you have with your health care provider. °  °Document Released: 07/10/2005 Document Revised: 06/21/2015 Document Reviewed: 06/09/2013 °Elsevier Interactive Patient Education ©2016 [employer] ° °Gastritis, Adult °Gastritis is soreness and swelling (inflammation) of the lining of the stomach. Gastritis can develop as a sudden onset (acute) or long-term (chronic) condition. If gastritis is not treated, it can lead to stomach bleeding and ulcers. °CAUSES  °Gastritis occurs when the stomach lining is weak or damaged. Digestive juices from the stomach then inflame the weakened stomach lining. The stomach lining may be weak or damaged due to viral or bacterial infections. One common bacterial infection is the Helicobacter pylori infection. Gastritis can also result from excessive alcohol consumption, taking certain medicines, or having too much acid in the stomach.  °SYMPTOMS  °In some cases, there are no symptoms. When symptoms are present, they may include: °· Pain or a burning sensation in the upper abdomen. °· Nausea. °· Vomiting. °· An uncomfortable feeling of fullness after eating. °DIAGNOSIS  °Your caregiver may suspect you have gastritis based on your symptoms and a physical exam. To determine the cause of your gastritis, your caregiver may perform the following: °· Blood or stool tests to check for the H pylori bacterium. °· Gastroscopy. A thin, flexible tube (endoscope) is passed down the esophagus and into the stomach. The endoscope has a light and camera on the end. Your caregiver uses the endoscope to view the inside of the stomach. °· Taking a tissue sample (  biopsy) from the stomach to examine under a microscope. °TREATMENT  °Depending on the cause of your gastritis, medicines may be prescribed. If you have a bacterial infection, such as an H pylori infection, antibiotics may be given. If your gastritis is caused by too much acid in the stomach, H2 blockers or antacids may be given. Your  caregiver may recommend that you stop taking aspirin, ibuprofen, or other nonsteroidal anti-inflammatory drugs (NSAIDs). °HOME CARE INSTRUCTIONS °· Only take over-the-counter or prescription medicines as directed by your caregiver. °· If you were given antibiotic medicines, take them as directed. Finish them even if you start to feel better. °· Drink enough fluids to keep your urine clear or pale yellow. °· Avoid foods and drinks that make your symptoms worse, such as: °¨ Caffeine or alcoholic drinks. °¨ Chocolate. °¨ Peppermint or mint flavorings. °¨ Garlic and onions. °¨ Spicy foods. °¨ Citrus fruits, such as oranges, lemons, or limes. °¨ Tomato-based foods such as sauce, chili, salsa, and pizza. °¨ Fried and fatty foods. °· Eat small, frequent meals instead of large meals. °SEEK IMMEDIATE MEDICAL CARE IF:  °· You have black or dark red stools. °· You vomit blood or material that looks like coffee grounds. °· You are unable to keep fluids down. °· Your abdominal pain gets worse. °· You have a fever. °· You do not feel better after 1 week. °· You have any other questions or concerns. °MAKE SURE YOU: °· Understand these instructions. °· Will watch your condition. °· Will get help right away if you are not doing well or get worse. °  °This information is not intended to replace advice given to you by your health care provider. Make sure you discuss any questions you have with your health care provider. °  °Document Released: 09/24/2001 Document Revised: 03/31/2012 Document Reviewed: 11/13/2011 °Elsevier Interactive Patient Education ©2016 Elsevier Inc. ° °

## 2015-07-25 NOTE — ED Notes (Signed)
First contact with pt. Pt alert x4 respirations easy non labored. NAD

## 2016-01-10 ENCOUNTER — Other Ambulatory Visit: Payer: Self-pay | Admitting: Preventative Medicine

## 2016-01-10 DIAGNOSIS — Z1231 Encounter for screening mammogram for malignant neoplasm of breast: Secondary | ICD-10-CM

## 2016-01-18 ENCOUNTER — Other Ambulatory Visit: Payer: Self-pay | Admitting: Preventative Medicine

## 2016-01-18 ENCOUNTER — Ambulatory Visit
Admission: RE | Admit: 2016-01-18 | Discharge: 2016-01-18 | Disposition: A | Discharge status: Home / Self Care | Payer: Medicare HMO | Source: Ambulatory Visit | Attending: Preventative Medicine | Admitting: Preventative Medicine

## 2016-01-18 DIAGNOSIS — Z1231 Encounter for screening mammogram for malignant neoplasm of breast: Secondary | ICD-10-CM

## 2016-06-28 ENCOUNTER — Emergency Department: Payer: Medicare HMO

## 2016-06-28 ENCOUNTER — Encounter: Payer: Self-pay | Admitting: Emergency Medicine

## 2016-06-28 ENCOUNTER — Emergency Department
Admission: EM | Admit: 2016-06-28 | Discharge: 2016-06-28 | Disposition: A | Discharge status: Home / Self Care | Payer: Medicare HMO | Attending: Emergency Medicine | Admitting: Emergency Medicine

## 2016-06-28 DIAGNOSIS — S199XXA Unspecified injury of neck, initial encounter: Secondary | ICD-10-CM | POA: Diagnosis present

## 2016-06-28 DIAGNOSIS — S7002XA Contusion of left hip, initial encounter: Secondary | ICD-10-CM | POA: Diagnosis not present

## 2016-06-28 DIAGNOSIS — G43909 Migraine, unspecified, not intractable, without status migrainosus: Secondary | ICD-10-CM | POA: Insufficient documentation

## 2016-06-28 DIAGNOSIS — Y9389 Activity, other specified: Secondary | ICD-10-CM | POA: Insufficient documentation

## 2016-06-28 DIAGNOSIS — S161XXA Strain of muscle, fascia and tendon at neck level, initial encounter: Secondary | ICD-10-CM

## 2016-06-28 DIAGNOSIS — F17210 Nicotine dependence, cigarettes, uncomplicated: Secondary | ICD-10-CM | POA: Insufficient documentation

## 2016-06-28 DIAGNOSIS — Y999 Unspecified external cause status: Secondary | ICD-10-CM | POA: Diagnosis not present

## 2016-06-28 DIAGNOSIS — I10 Essential (primary) hypertension: Secondary | ICD-10-CM | POA: Diagnosis not present

## 2016-06-28 DIAGNOSIS — Y92410 Unspecified street and highway as the place of occurrence of the external cause: Secondary | ICD-10-CM | POA: Insufficient documentation

## 2016-06-28 MED ORDER — OXYCODONE-ACETAMINOPHEN 5-325 MG PO TABS
1.0000 | ORAL_TABLET | Freq: Once | ORAL | Status: AC
  Administered 2016-06-28: 1 via ORAL
  Filled 2016-06-28: qty 1

## 2016-06-28 MED ORDER — OXYCODONE-ACETAMINOPHEN 5-325 MG PO TABS: 1.0000 | ORAL_TABLET | Freq: Four times a day (QID) | ORAL | 0 refills | Status: DC | PRN
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
## 2016-06-28 NOTE — ED Notes (Signed)
Pt ambulated to hallway with this nurse per MD request with steady gait.
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
## 2017-06-30 ENCOUNTER — Other Ambulatory Visit: Payer: Self-pay | Admitting: Family Medicine

## 2017-07-01 ENCOUNTER — Other Ambulatory Visit: Payer: Self-pay | Admitting: Family Medicine

## 2017-07-01 DIAGNOSIS — Z1231 Encounter for screening mammogram for malignant neoplasm of breast: Secondary | ICD-10-CM

## 2017-07-01 DIAGNOSIS — Z13820 Encounter for screening for osteoporosis: Secondary | ICD-10-CM

## 2017-08-07 ENCOUNTER — Ambulatory Visit
Admission: RE | Admit: 2017-08-07 | Discharge: 2017-08-07 | Disposition: A | Discharge status: Home / Self Care | Payer: Medicare HMO | Source: Ambulatory Visit | Attending: Family Medicine | Admitting: Family Medicine

## 2017-08-07 DIAGNOSIS — M810 Age-related osteoporosis without current pathological fracture: Secondary | ICD-10-CM | POA: Insufficient documentation

## 2017-08-07 DIAGNOSIS — Z13820 Encounter for screening for osteoporosis: Secondary | ICD-10-CM | POA: Insufficient documentation

## 2017-08-07 DIAGNOSIS — Z1231 Encounter for screening mammogram for malignant neoplasm of breast: Secondary | ICD-10-CM | POA: Diagnosis not present

## 2017-08-08 ENCOUNTER — Other Ambulatory Visit: Payer: Self-pay | Admitting: Family Medicine

## 2017-08-08 DIAGNOSIS — R928 Other abnormal and inconclusive findings on diagnostic imaging of breast: Secondary | ICD-10-CM

## 2017-08-08 DIAGNOSIS — N6320 Unspecified lump in the left breast, unspecified quadrant: Secondary | ICD-10-CM

## 2017-08-18 ENCOUNTER — Ambulatory Visit
Admission: RE | Admit: 2017-08-18 | Discharge: 2017-08-18 | Disposition: A | Discharge status: Home / Self Care | Payer: Medicare HMO | Source: Ambulatory Visit | Attending: Family Medicine | Admitting: Family Medicine

## 2017-08-18 DIAGNOSIS — N6320 Unspecified lump in the left breast, unspecified quadrant: Secondary | ICD-10-CM | POA: Diagnosis present

## 2017-08-18 DIAGNOSIS — R928 Other abnormal and inconclusive findings on diagnostic imaging of breast: Secondary | ICD-10-CM

## 2018-02-05 ENCOUNTER — Other Ambulatory Visit: Payer: Self-pay | Admitting: Family Medicine

## 2018-02-05 DIAGNOSIS — R928 Other abnormal and inconclusive findings on diagnostic imaging of breast: Secondary | ICD-10-CM

## 2018-02-20 ENCOUNTER — Ambulatory Visit
Admission: RE | Admit: 2018-02-20 | Discharge: 2018-02-20 | Disposition: A | Discharge status: Home / Self Care | Payer: Medicare HMO | Source: Ambulatory Visit | Attending: Family Medicine | Admitting: Family Medicine

## 2018-02-20 DIAGNOSIS — N6321 Unspecified lump in the left breast, upper outer quadrant: Secondary | ICD-10-CM | POA: Insufficient documentation

## 2018-02-20 DIAGNOSIS — R928 Other abnormal and inconclusive findings on diagnostic imaging of breast: Secondary | ICD-10-CM | POA: Insufficient documentation

## 2018-05-13 ENCOUNTER — Other Ambulatory Visit: Payer: Self-pay | Admitting: Student

## 2018-05-13 DIAGNOSIS — M1711 Unilateral primary osteoarthritis, right knee: Secondary | ICD-10-CM

## 2018-06-05 ENCOUNTER — Ambulatory Visit
Admission: RE | Admit: 2018-06-05 | Discharge: 2018-06-05 | Disposition: A | Discharge status: Home / Self Care | Payer: Medicare HMO | Source: Ambulatory Visit | Attending: Student | Admitting: Student

## 2018-06-05 DIAGNOSIS — S83221A Peripheral tear of medial meniscus, current injury, right knee, initial encounter: Secondary | ICD-10-CM | POA: Insufficient documentation

## 2018-06-05 DIAGNOSIS — S83241A Other tear of medial meniscus, current injury, right knee, initial encounter: Secondary | ICD-10-CM | POA: Diagnosis not present

## 2018-06-05 DIAGNOSIS — S72434A Nondisplaced fracture of medial condyle of right femur, initial encounter for closed fracture: Secondary | ICD-10-CM | POA: Insufficient documentation

## 2018-06-05 DIAGNOSIS — M1711 Unilateral primary osteoarthritis, right knee: Secondary | ICD-10-CM | POA: Insufficient documentation

## 2018-07-17 ENCOUNTER — Other Ambulatory Visit: Payer: Self-pay | Admitting: Family Medicine

## 2018-07-17 DIAGNOSIS — N6320 Unspecified lump in the left breast, unspecified quadrant: Secondary | ICD-10-CM

## 2018-07-29 ENCOUNTER — Other Ambulatory Visit: Payer: Self-pay | Admitting: Family Medicine

## 2018-07-29 DIAGNOSIS — N6320 Unspecified lump in the left breast, unspecified quadrant: Secondary | ICD-10-CM

## 2018-08-07 ENCOUNTER — Encounter: Payer: Self-pay | Admitting: Obstetrics and Gynecology

## 2018-08-12 ENCOUNTER — Encounter: Payer: Self-pay | Admitting: Obstetrics and Gynecology

## 2018-08-12 ENCOUNTER — Ambulatory Visit (INDEPENDENT_AMBULATORY_CARE_PROVIDER_SITE_OTHER): Payer: Medicare HMO | Admitting: Obstetrics and Gynecology

## 2018-08-12 DIAGNOSIS — Z6841 Body Mass Index (BMI) 40.0 and over, adult: Secondary | ICD-10-CM

## 2018-08-12 MED ORDER — CYANOCOBALAMIN 1000 MCG/ML IJ SOLN: 1000.0000 ug | INTRAMUSCULAR | 1 refills | Status: DC

## 2018-08-12 MED ORDER — PHENTERMINE HCL 37.5 MG PO TABS: 37.5000 mg | ORAL_TABLET | Freq: Every day | ORAL | 2 refills | Status: DC

## 2018-08-12 NOTE — Patient Instructions (Signed)
Increase daily water intake to at least 8 bottle a day, every day.  Goal is to reduse weight by 10% by end of three months, and will re-evaluate then.  Return to clinic in 4 weeks for Nurse visit to check weight & BP, and get next B12 injections.    Please refer to After Visit Summary for other counseling recommendations.   Consider the Low Glycemic Index Diet and 6 smaller meals daily .  This boosts your metabolism and regulates your sugars:   Use the protein bar by [employer] because they have lots of fiber in them  Find the low carb flatbreads, tortillas and pita breads for sandwiches:  Joseph's makes a pita bread and a flat bread , available at [facility] and [employer]; Toufayah makes a low carb flatbread available at [employer] and [employer] that is 9 net carbs and 100 cal [place] makes a low carb whole wheat tortilla available at [employer] most grocery stores with 6 net carbs and 210 cal  [place] yogurt can still have a lot of carbs .  Dannon Light N fit has 80 cal and 8 carbs

## 2018-08-12 NOTE — Progress Notes (Signed)
Subjective:  Jillian Mccullough is a 72 y.o. No obstetric history on file. at Unknown being seen today for weight loss management- initial visit.  Patient reports General ROS: negative and reports previous weight loss attempts: smoking and increased water intake.   Onset was sudden over last 6 months after stopping smoking.   Associated symptoms include: fatigue.    Pertinent medical history includes: hypertension & elevated cholesterol.  Risk factors include: social isolation, poverty, alcoholism, illicit drug use, excessive exercise, poor dentition and new medication.  The patient has a surgical history of: appendectomy & hysterectomy.  Pertinent social history includes: previous tobacco abuse.   Past evaluation has included: metabolic profile, hemoglobin A1c, thyroid panel.    The following portions of the patient's history were reviewed and updated as appropriate: allergies, current medications, past family history, past medical history, past social history, past surgical history and problem list.   Objective:   Vitals:   08/12/18 1356  BP: (!) 151/72  Pulse: 77  Weight: 278 lb 14.4 oz (126.5 kg)  Height: 5\' 3"  (1.6 m)    General:  Alert, oriented and cooperative. Patient is in no acute distress.  :   :   :   :   :   :   PE: Well groomed female in no current distress,   Mental Status: Normal mood and affect. Normal behavior. Normal judgment and thought content.   Current BMI: Body mass index is 49.4 kg/m.   Assessment and Plan:  Obesity  There are no diagnoses linked to this encounter.  Plan: low carb, High protein diet RX for adipex 37.5 mg daily and B12 .ml monthly, to start now with first injection given at today's visit. Reviewed side-effects common to both medications and expected outcomes. Increase daily water intake to at least 8 bottle a day, every day.  Goal is to reduse weight by 10% by end of three months, and will re-evaluate then.  RTC in 4 weeks  for Nurse visit to check weight & BP, and get next B12 injections.    Please refer to After Visit Summary for other counseling recommendations.    Daanya Lanphier N, CNM   Consider the Low Glycemic Index Diet and 6 smaller meals daily .  This boosts your metabolism and regulates your sugars:   Use the protein bar by [employer] because they have lots of fiber in them  Find the low carb flatbreads, tortillas and pita breads for sandwiches:  Joseph's makes a pita bread and a flat bread , available at [facility] and [employer]; Toufayah makes a low carb flatbread available at [employer] and [employer] that is 9 net carbs and 100 cal [place] makes a low carb whole wheat tortilla available at [employer] most grocery stores with 6 net carbs and 210 cal  [place] yogurt can still have a lot of carbs .  Dannon Light N fit has 80 cal and 8 carbs

## 2018-08-13 ENCOUNTER — Ambulatory Visit
Admission: RE | Admit: 2018-08-13 | Discharge: 2018-08-13 | Disposition: A | Discharge status: Home / Self Care | Payer: Medicare HMO | Source: Ambulatory Visit | Attending: Family Medicine | Admitting: Family Medicine

## 2018-08-13 DIAGNOSIS — N6320 Unspecified lump in the left breast, unspecified quadrant: Secondary | ICD-10-CM

## 2018-09-09 ENCOUNTER — Encounter: Payer: Medicare HMO | Admitting: Obstetrics and Gynecology

## 2018-09-15 ENCOUNTER — Encounter: Payer: Medicare HMO | Admitting: Obstetrics and Gynecology

## 2018-09-24 ENCOUNTER — Ambulatory Visit (INDEPENDENT_AMBULATORY_CARE_PROVIDER_SITE_OTHER): Payer: Medicare HMO | Admitting: Obstetrics and Gynecology

## 2018-09-24 ENCOUNTER — Encounter: Payer: Self-pay | Admitting: Obstetrics and Gynecology

## 2018-09-24 MED ORDER — CYANOCOBALAMIN 1000 MCG/ML IJ SOLN
1000.0000 ug | Freq: Once | INTRAMUSCULAR | Status: AC
  Administered 2018-09-24: 1000 ug via INTRAMUSCULAR

## 2018-09-24 NOTE — Progress Notes (Signed)
Pt is here for wt, bp check, b-12  She is doing well, denies any s/e   09/24/18 wt- 273.1lb 08/12/18 wt-278lb  Waist  45 in

## 2018-10-22 ENCOUNTER — Ambulatory Visit (INDEPENDENT_AMBULATORY_CARE_PROVIDER_SITE_OTHER): Payer: Medicare HMO | Admitting: Obstetrics and Gynecology

## 2018-10-22 ENCOUNTER — Encounter: Payer: Self-pay | Admitting: Obstetrics and Gynecology

## 2018-10-22 DIAGNOSIS — Z6841 Body Mass Index (BMI) 40.0 and over, adult: Secondary | ICD-10-CM | POA: Diagnosis not present

## 2018-10-22 MED ORDER — PHENTERMINE HCL 37.5 MG PO TABS: 37.5000 mg | ORAL_TABLET | Freq: Every day | ORAL | 2 refills | Status: DC

## 2018-10-22 MED ORDER — CYANOCOBALAMIN 1000 MCG/ML IJ SOLN
1000.0000 ug | Freq: Once | INTRAMUSCULAR | Status: AC
  Administered 2018-10-22: 1000 ug via INTRAMUSCULAR

## 2018-10-22 NOTE — Progress Notes (Signed)
Pt is here for wt, bp check, b-12 inj She is doing well, denies any s/e She didn't lose but a 1.5 lbs  10/22/18 wt- 271.1lb 09/24/18 wt- 273lb  Waist- 46 in  States she has cut back on carbs, meat and junk foods.  Still can't walk much due to right knee pain. Medications ortho prescribing isn't helping. Offered referral to Dr Terrilee Files, declined at this time due to not wanting to drive that far.  Recommend chair exercises.   Will continue meds at this time.   Melody Shambley,CNM

## 2018-10-22 NOTE — Patient Instructions (Signed)
Exercises To Do While Sitting  Exercises that you do while sitting (chair exercises) can give you many of the same benefits as full exercise. Benefits include strengthening your heart, burning calories, and keeping muscles and joints healthy. Exercise can also improve your mood and help with depression and anxiety. You may benefit from chair exercises if you are unable to do standing exercises because of:  Diabetic foot pain.  Obesity.  Illness.  Arthritis.  Recovery from surgery or injury.  Breathing problems.  Balance problems.  Another type of disability. Before starting chair exercises, check with your health care provider or a physical therapist to find out how much exercise you can tolerate and which exercises are safe for you. If your health care provider approves:  Start out slowly and build up over time. Aim to work up to about 10-20 minutes for each exercise session.  Make exercise part of your daily routine.  Drink water when you exercise. Do not wait until you are thirsty. Drink every 10-15 minutes.  Stop exercising right away if you have pain, nausea, shortness of breath, or dizziness.  If you are exercising in a wheelchair, make sure to lock the wheels.  Ask your health care provider whether you can do tai chi or yoga. Many positions in these mind-body exercises can be modified to do while seated. Warm-up Before starting other exercises: 1. Sit up as straight as you can. Have your knees bent at 90 degrees, which is the shape of the capital letter "L." Keep your feet flat on the floor. 2. Sit at the front edge of your chair, if you can. 3. Pull in (tighten) the muscles in your abdomen and stretch your spine and neck as straight as you can. Hold this position for a few minutes. 4. Breathe in and out evenly. Try to concentrate on your breathing, and relax your mind. Stretching Exercise A: Arm stretch 1. Hold your arms out straight in front of your body. 2. Bend  your hands at the wrist with your fingers pointing up, as if signaling someone to stop. Notice the slight tension in your forearms as you hold the position. 3. Keeping your arms out and your hands bent, rotate your hands outward as far as you can and hold this stretch. Aim to have your thumbs pointing up and your pinkie fingers pointing down. Slowly repeat arm stretches for one minute as tolerated. Exercise B: Leg stretch 1. If you can move your legs, try to "draw" letters on the floor with the toes of your foot. Write your name with one foot. 2. Write your name with the toes of your other foot. Slowly repeat the movements for one minute as tolerated. Exercise C: Reach for the sky 1. Reach your hands as far over your head as you can to stretch your spine. 2. Move your hands and arms as if you are climbing a rope. Slowly repeat the movements for one minute as tolerated. Range of motion exercises Exercise A: Shoulder roll 1. Let your arms hang loosely at your sides. 2. Lift just your shoulders up toward your ears, then let them relax back down. 3. When your shoulders feel loose, rotate your shoulders in backward and forward circles. Do shoulder rolls slowly for one minute as tolerated. Exercise B: March in place 1. As if you are marching, pump your arms and lift your legs up and down. Lift your knees as high as you can. ? If you are unable to lift your knees,  just pump your arms and move your ankles and feet up and down. March in place for one minute as tolerated. Exercise C: Seated jumping jacks 1. Let your arms hang down straight. 2. Keeping your arms straight, lift them up over your head. Aim to point your fingers to the ceiling. 3. While you lift your arms, straighten your legs and slide your heels along the floor to your sides, as wide as you can. 4. As you bring your arms back down to your sides, slide your legs back together. ? If you are unable to use your legs, just move your  arms. Slowly repeat seated jumping jacks for one minute as tolerated. Strengthening exercises Exercise A: Shoulder squeeze 1. Hold your arms straight out from your body to your sides, with your elbows bent and your fists pointed at the ceiling. 2. Keeping your arms in the bent position, move them forward so your elbows and forearms meet in front of your face. 3. Open your arms back out as wide as you can with your elbows still bent, until you feel your shoulder blades squeezing together. Hold for 5 seconds. Slowly repeat the movements forward and backward for one minute as tolerated. Contact a health care provider if you:  Had to stop exercising due to any of the following: ? Pain. ? Nausea. ? Shortness of breath. ? Dizziness. ? Fatigue.  Have significant pain or soreness after exercising. Get help right away if you have:  Chest pain.  Difficulty breathing. These symptoms may represent a serious problem that is an emergency. Do not wait to see if the symptoms will go away. Get medical help right away. Call your local emergency services (911 in the [place] Do not drive yourself to the hospital. This information is not intended to replace advice given to you by your health care provider. Make sure you discuss any questions you have with your health care provider. Document Released: 08/13/2017 Document Revised: 08/13/2017 Document Reviewed: 08/13/2017 Elsevier Interactive Patient Education  2019 [employer].

## 2018-11-26 ENCOUNTER — Encounter: Payer: Self-pay | Admitting: Obstetrics and Gynecology

## 2018-11-26 ENCOUNTER — Ambulatory Visit (INDEPENDENT_AMBULATORY_CARE_PROVIDER_SITE_OTHER): Payer: Medicare HMO | Admitting: Obstetrics and Gynecology

## 2018-11-26 MED ORDER — CYANOCOBALAMIN 1000 MCG/ML IJ SOLN
1000.0000 ug | Freq: Once | INTRAMUSCULAR | Status: AC
  Administered 2018-11-26: 1000 ug via INTRAMUSCULAR

## 2018-11-26 NOTE — Progress Notes (Signed)
Pt is here for wt, bp check, b-12 inj She is doing well, denies any s/e  11/26/18 wt- 273lb 10/22/18 wt- 271lb   Waist 46 in

## 2018-12-24 ENCOUNTER — Ambulatory Visit (INDEPENDENT_AMBULATORY_CARE_PROVIDER_SITE_OTHER): Payer: Medicare HMO | Admitting: Obstetrics and Gynecology

## 2018-12-24 ENCOUNTER — Encounter: Payer: Self-pay | Admitting: Obstetrics and Gynecology

## 2018-12-24 MED ORDER — CYANOCOBALAMIN 1000 MCG/ML IJ SOLN
1000.0000 ug | Freq: Once | INTRAMUSCULAR | Status: AC
  Administered 2018-12-24: 1000 ug via INTRAMUSCULAR

## 2018-12-24 NOTE — Progress Notes (Signed)
Pt is here for wt, bp check, b-12 inj She is doing well, denies any s/e  12/24/18 wt- 267.9lb 11/26/18 wt- 273lb  Waist 45.5 in

## 2019-01-21 ENCOUNTER — Encounter: Payer: Medicare HMO | Admitting: Obstetrics and Gynecology

## 2019-03-09 ENCOUNTER — Telehealth: Payer: Self-pay

## 2019-03-09 NOTE — Telephone Encounter (Signed)
Coronavirus (COVID-19) Are you at risk?  Are you at risk for the Coronavirus (COVID-19)?  To be considered HIGH RISK for Coronavirus (COVID-19), you have to meet the following criteria:  . Traveled to [place], [place], [place], [place] or [place]; or in the [place] to [place], [place], [place], or [place]; and have fever, cough, and shortness of breath within the last 2 weeks of travel OR . Been in close contact with a person diagnosed with COVID-19 within the last 2 weeks and have fever, cough, and shortness of breath . IF YOU DO NOT MEET THESE CRITERIA, YOU ARE CONSIDERED LOW RISK FOR COVID-19.  What to do if you are HIGH RISK for COVID-19?  . If you are having a medical emergency, call 911. . Seek medical care right away. Before you go to a doctor's office, urgent care or emergency department, call ahead and tell them about your recent travel, contact with someone diagnosed with COVID-19, and your symptoms. You should receive instructions from your physician's office regarding next steps of care.  . When you arrive at healthcare provider, tell the healthcare staff immediately you have returned from visiting [place], [place], [place], [place] or [place]; or traveled in the [place] to [place], [place], [place], or [place]; in the last two weeks or you have been in close contact with a person diagnosed with COVID-19 in the last 2 weeks.   . Tell the health care staff about your symptoms: fever, cough and shortness of breath. . After you have been seen by a medical provider, you will be either: o Tested for (COVID-19) and discharged home on quarantine except to seek medical care if symptoms worsen, and asked to  - Stay home and avoid contact with others until you get your results (4-5 days)  - Avoid travel on public transportation if possible (such as bus, train, or airplane) or o Sent to the Emergency Department by EMS for evaluation, COVID-19 testing, and possible  admission depending on your condition and test results.  What to do if you are LOW RISK for COVID-19?  Reduce your risk of any infection by using the same precautions used for avoiding the common cold or flu:  . Wash your hands often with soap and warm water for at least 20 seconds.  If soap and water are not readily available, use an alcohol-based hand sanitizer with at least 60% alcohol.  . If coughing or sneezing, cover your mouth and nose by coughing or sneezing into the elbow areas of your shirt or coat, into a tissue or into your sleeve (not your hands). . Avoid shaking hands with others and consider head nods or verbal greetings only. . Avoid touching your eyes, nose, or mouth with unwashed hands.  . Avoid close contact with people who are sick. . Avoid places or events with large numbers of people in one location, like concerts or sporting events. . Carefully consider travel plans you have or are making. . If you are planning any travel outside or inside the [place], visit the CDC's Travelers' Health webpage for the latest health notices. . If you have some symptoms but not all symptoms, continue to monitor at home and seek medical attention if your symptoms worsen. . If you are having a medical emergency, call 911.   ADDITIONAL HEALTHCARE OPTIONS FOR PATIENTS  [facility] / e-Visit: [email or web address]         [facility]: [phone]  [facility]: [phone]                   [facility]: [phone]   Pre-screen negative, DM.   

## 2019-03-10 ENCOUNTER — Other Ambulatory Visit: Payer: Self-pay

## 2019-03-10 ENCOUNTER — Encounter: Payer: Self-pay | Admitting: Obstetrics and Gynecology

## 2019-03-10 ENCOUNTER — Ambulatory Visit (INDEPENDENT_AMBULATORY_CARE_PROVIDER_SITE_OTHER): Payer: Medicare HMO | Admitting: Obstetrics and Gynecology

## 2019-03-10 VITALS — BP 128/66 | HR 96 | Ht 63.0 in | Wt 265.9 lb

## 2019-03-10 DIAGNOSIS — Z7689 Persons encountering health services in other specified circumstances: Secondary | ICD-10-CM

## 2019-03-10 DIAGNOSIS — Z6841 Body Mass Index (BMI) 40.0 and over, adult: Secondary | ICD-10-CM | POA: Diagnosis not present

## 2019-03-10 DIAGNOSIS — E669 Obesity, unspecified: Secondary | ICD-10-CM | POA: Diagnosis not present

## 2019-03-10 MED ORDER — PHENTERMINE HCL 37.5 MG PO TABS
37.5000 mg | ORAL_TABLET | Freq: Every day | ORAL | 2 refills | Status: DC
Start: 2019-03-10 — End: 2019-07-14

## 2019-03-10 MED ORDER — CYANOCOBALAMIN 1000 MCG/ML IJ SOLN: 1000.0000 ug | INTRAMUSCULAR | 1 refills | Status: DC

## 2019-03-10 NOTE — Progress Notes (Signed)
    SUBJECTIVE:  73 y.o. here for follow-up weight loss visit, previously seen 8 weeks ago. Denies any concerns and feels like medication was working well, but has been out for one month. States she has increased eating since pandemic stay at home. More out of boredom. Lives with disabled adult son.  Did restart smoking. Still lost 2 lbs in that time. Desires restarting meds.  OBJECTIVE:  BP 128/66   Pulse 96   Ht 5\' 3"  (1.6 m)   Wt 265 lb 14.4 oz (120.6 kg)   BMI 47.10 kg/m   Body mass index is 47.1 kg/m. Patient appears well. ASSESSMENT:  Obesity- responding well to weight loss plan PLAN:  To restart weight loss medications. B12 1070mcg/ml injection given, encouraged smoking cessation. RTC in 4 weeks as planned  Melody Shambley, CNM  Waist 45.5 in

## 2019-04-06 ENCOUNTER — Telehealth: Payer: Self-pay

## 2019-04-06 NOTE — Telephone Encounter (Signed)
Coronavirus (COVID-19) Are you at risk?  Are you at risk for the Coronavirus (COVID-19)?  To be considered HIGH RISK for Coronavirus (COVID-19), you have to meet the following criteria:  . Traveled to [place], [place], [place], [place] or [place]; or in the [place] to [place], [place], [place], or [place]; and have fever, cough, and shortness of breath within the last 2 weeks of travel OR . Been in close contact with a person diagnosed with COVID-19 within the last 2 weeks and have fever, cough, and shortness of breath . IF YOU DO NOT MEET THESE CRITERIA, YOU ARE CONSIDERED LOW RISK FOR COVID-19.  What to do if you are HIGH RISK for COVID-19?  . If you are having a medical emergency, call 911. . Seek medical care right away. Before you go to a doctor's office, urgent care or emergency department, call ahead and tell them about your recent travel, contact with someone diagnosed with COVID-19, and your symptoms. You should receive instructions from your physician's office regarding next steps of care.  . When you arrive at healthcare provider, tell the healthcare staff immediately you have returned from visiting [place], [place], [place], [place] or [place]; or traveled in the [place] to [place], [place], [place], or [place]; in the last two weeks or you have been in close contact with a person diagnosed with COVID-19 in the last 2 weeks.   . Tell the health care staff about your symptoms: fever, cough and shortness of breath. . After you have been seen by a medical provider, you will be either: o Tested for (COVID-19) and discharged home on quarantine except to seek medical care if symptoms worsen, and asked to  - Stay home and avoid contact with others until you get your results (4-5 days)  - Avoid travel on public transportation if possible (such as bus, train, or airplane) or o Sent to the Emergency Department by EMS for evaluation, COVID-19 testing, and possible  admission depending on your condition and test results.  What to do if you are LOW RISK for COVID-19?  Reduce your risk of any infection by using the same precautions used for avoiding the common cold or flu:  . Wash your hands often with soap and warm water for at least 20 seconds.  If soap and water are not readily available, use an alcohol-based hand sanitizer with at least 60% alcohol.  . If coughing or sneezing, cover your mouth and nose by coughing or sneezing into the elbow areas of your shirt or coat, into a tissue or into your sleeve (not your hands). . Avoid shaking hands with others and consider head nods or verbal greetings only. . Avoid touching your eyes, nose, or mouth with unwashed hands.  . Avoid close contact with people who are sick. . Avoid places or events with large numbers of people in one location, like concerts or sporting events. . Carefully consider travel plans you have or are making. . If you are planning any travel outside or inside the [place], visit the CDC's Travelers' Health webpage for the latest health notices. . If you have some symptoms but not all symptoms, continue to monitor at home and seek medical attention if your symptoms worsen. . If you are having a medical emergency, call 911.   ADDITIONAL HEALTHCARE OPTIONS FOR PATIENTS  [facility] / e-Visit: [email or web address]         [facility]: [phone]  [facility]: [phone]                   [facility]: [phone]   Pre-screen negative, DM.   

## 2019-04-07 ENCOUNTER — Ambulatory Visit (INDEPENDENT_AMBULATORY_CARE_PROVIDER_SITE_OTHER): Payer: Medicare HMO | Admitting: Obstetrics and Gynecology

## 2019-04-07 ENCOUNTER — Encounter: Payer: Self-pay | Admitting: Obstetrics and Gynecology

## 2019-04-07 ENCOUNTER — Other Ambulatory Visit: Payer: Self-pay

## 2019-04-07 VITALS — BP 107/77 | HR 104 | Ht 63.0 in | Wt 261.8 lb

## 2019-04-07 DIAGNOSIS — Z713 Dietary counseling and surveillance: Secondary | ICD-10-CM

## 2019-04-07 DIAGNOSIS — Z6841 Body Mass Index (BMI) 40.0 and over, adult: Secondary | ICD-10-CM | POA: Diagnosis not present

## 2019-04-07 MED ORDER — CYANOCOBALAMIN 1000 MCG/ML IJ SOLN
1000.0000 ug | Freq: Once | INTRAMUSCULAR | Status: AC
  Administered 2019-04-07: 1000 ug via INTRAMUSCULAR

## 2019-04-07 NOTE — Progress Notes (Signed)
Pt is here for wt, bp check, b-12 inj She is doing well, denies any s/e  04/07/19 wt- 261.8lb 03/10/19 wt- 265lb  Waist 45 in

## 2019-05-06 ENCOUNTER — Ambulatory Visit: Payer: Medicare HMO | Admitting: Obstetrics and Gynecology

## 2019-05-06 ENCOUNTER — Other Ambulatory Visit: Payer: Self-pay

## 2019-05-06 VITALS — BP 128/66 | HR 88 | Ht 63.0 in | Wt 260.5 lb

## 2019-05-06 DIAGNOSIS — Z7689 Persons encountering health services in other specified circumstances: Secondary | ICD-10-CM

## 2019-05-06 MED ORDER — CYANOCOBALAMIN 1000 MCG/ML IJ SOLN
1000.0000 ug | Freq: Once | INTRAMUSCULAR | Status: AC
  Administered 2019-05-06: 1000 ug via INTRAMUSCULAR

## 2019-05-06 NOTE — Progress Notes (Unsigned)
Pt is here for wt, bp check, b-12 inj She is doing well, denies any s/e  05/06/19 wt 260.5lb 04/07/19 wt- 261.8lb 03/10/19 wt- 265lb

## 2019-06-04 ENCOUNTER — Ambulatory Visit (INDEPENDENT_AMBULATORY_CARE_PROVIDER_SITE_OTHER): Payer: Medicare HMO | Admitting: Obstetrics and Gynecology

## 2019-06-04 ENCOUNTER — Other Ambulatory Visit: Payer: Self-pay

## 2019-06-04 ENCOUNTER — Encounter: Payer: Self-pay | Admitting: Obstetrics and Gynecology

## 2019-06-04 VITALS — BP 138/86 | HR 98 | Ht 63.0 in | Wt 257.3 lb

## 2019-06-04 DIAGNOSIS — Z6841 Body Mass Index (BMI) 40.0 and over, adult: Secondary | ICD-10-CM

## 2019-06-04 DIAGNOSIS — Z713 Dietary counseling and surveillance: Secondary | ICD-10-CM | POA: Diagnosis not present

## 2019-06-04 DIAGNOSIS — Z7689 Persons encountering health services in other specified circumstances: Secondary | ICD-10-CM

## 2019-06-04 MED ORDER — CYANOCOBALAMIN 1000 MCG/ML IJ SOLN
1000.0000 ug | Freq: Once | INTRAMUSCULAR | Status: AC
  Administered 2019-06-04: 1000 ug via INTRAMUSCULAR

## 2019-06-04 NOTE — Progress Notes (Signed)
Pt is here for wt, bp check, b-12 inj She is doing well, denies any s/e Down 21#s in 10 months. Is still struggling to exercise due to right knee that has flared up.  Desires continuing meds at this time.   Blood pressure 138/86, pulse 98, height 5\' 3"  (1.6 m), weight 257 lb 4.8 oz (116.7 kg).  B12 injection given. Will continue with medications.  RTC in 4 weeks.  Melody Shambley,CNM

## 2019-06-24 ENCOUNTER — Other Ambulatory Visit: Payer: Self-pay

## 2019-06-24 DIAGNOSIS — Z20822 Contact with and (suspected) exposure to covid-19: Secondary | ICD-10-CM

## 2019-06-25 LAB — NOVEL CORONAVIRUS, NAA: SARS-CoV-2, NAA: NOT DETECTED

## 2019-07-01 ENCOUNTER — Ambulatory Visit (INDEPENDENT_AMBULATORY_CARE_PROVIDER_SITE_OTHER): Payer: Medicare HMO | Admitting: Obstetrics and Gynecology

## 2019-07-01 ENCOUNTER — Encounter: Payer: Self-pay | Admitting: Obstetrics and Gynecology

## 2019-07-01 ENCOUNTER — Other Ambulatory Visit: Payer: Self-pay

## 2019-07-01 VITALS — BP 125/62 | HR 93 | Ht 63.0 in | Wt 258.0 lb

## 2019-07-01 DIAGNOSIS — Z713 Dietary counseling and surveillance: Secondary | ICD-10-CM

## 2019-07-01 DIAGNOSIS — Z6841 Body Mass Index (BMI) 40.0 and over, adult: Secondary | ICD-10-CM

## 2019-07-01 MED ORDER — CYANOCOBALAMIN 1000 MCG/ML IJ SOLN
1000.0000 ug | Freq: Once | INTRAMUSCULAR | Status: AC
  Administered 2019-07-01: 14:00:00 1000 ug via INTRAMUSCULAR

## 2019-07-01 NOTE — Progress Notes (Signed)
Pt is here for wt, bp check,b 12 unj She is doing well, denies any s/e She would like refill on her Phentermine   06/30/19 wt- 258lb 06/04/19 wt- 257lb

## 2019-07-14 ENCOUNTER — Other Ambulatory Visit: Payer: Self-pay | Admitting: *Deleted

## 2019-07-14 ENCOUNTER — Telehealth: Payer: Self-pay | Admitting: Obstetrics and Gynecology

## 2019-07-14 MED ORDER — PHENTERMINE HCL 37.5 MG PO TABS: 37.5000 mg | ORAL_TABLET | Freq: Every day | ORAL | 2 refills | Status: DC

## 2019-07-14 NOTE — Telephone Encounter (Signed)
Done-ac 

## 2019-07-14 NOTE — Telephone Encounter (Signed)
The patient called to check and see if the nurse/provider has refilled her prescription already. Pt is requesting a call back. Please advise.

## 2019-08-04 ENCOUNTER — Encounter: Payer: Self-pay | Admitting: Obstetrics and Gynecology

## 2019-08-04 ENCOUNTER — Ambulatory Visit (INDEPENDENT_AMBULATORY_CARE_PROVIDER_SITE_OTHER): Payer: Medicare HMO | Admitting: Obstetrics and Gynecology

## 2019-08-04 ENCOUNTER — Other Ambulatory Visit: Payer: Self-pay

## 2019-08-04 VITALS — BP 113/59 | HR 79 | Wt 255.3 lb

## 2019-08-04 DIAGNOSIS — Z7689 Persons encountering health services in other specified circumstances: Secondary | ICD-10-CM

## 2019-08-04 DIAGNOSIS — Z6841 Body Mass Index (BMI) 40.0 and over, adult: Secondary | ICD-10-CM

## 2019-08-04 DIAGNOSIS — E669 Obesity, unspecified: Secondary | ICD-10-CM

## 2019-08-04 MED ORDER — CYANOCOBALAMIN 1000 MCG/ML IJ SOLN: 1000.0000 ug | INTRAMUSCULAR | 1 refills | Status: DC

## 2019-08-04 MED ORDER — CYANOCOBALAMIN 1000 MCG/ML IJ SOLN
1000.0000 ug | Freq: Once | INTRAMUSCULAR | Status: AC
  Administered 2019-08-04: 1000 ug via INTRAMUSCULAR

## 2019-08-04 NOTE — Progress Notes (Signed)
SUBJECTIVE:  73 y.o. here for follow-up weight loss visit, previously seen 4 weeks ago. Denies any concerns and feels like medication is still working well, has lost 2.5 more pounds.  OBJECTIVE:  BP (!) 113/59   Pulse 79   Wt 255 lb 5 oz (115.8 kg)   BMI 45.23 kg/m   Body mass index is 45.23 kg/m. Patient appears well. ASSESSMENT:  Obesity- responding well to weight loss plan PLAN:  To continue with current medications. B12 1063mcg/ml injection given RTC in 4 weeks as planned  Melody [place], CNM

## 2019-08-04 NOTE — Progress Notes (Signed)
Patient is here for a weight check, BP and Vit B12 inj. Weight loss of 2.5 lbs. Waist circumference 47inches.

## 2019-09-02 ENCOUNTER — Encounter: Payer: Medicare HMO | Admitting: Obstetrics and Gynecology

## 2019-09-13 ENCOUNTER — Encounter: Payer: Self-pay | Admitting: Certified Nurse Midwife

## 2019-09-13 ENCOUNTER — Ambulatory Visit (INDEPENDENT_AMBULATORY_CARE_PROVIDER_SITE_OTHER): Payer: Medicare HMO | Admitting: Certified Nurse Midwife

## 2019-09-13 ENCOUNTER — Other Ambulatory Visit: Payer: Self-pay

## 2019-09-13 VITALS — BP 127/81 | HR 97 | Ht 63.0 in | Wt 254.1 lb

## 2019-09-13 DIAGNOSIS — Z7689 Persons encountering health services in other specified circumstances: Secondary | ICD-10-CM | POA: Diagnosis not present

## 2019-09-13 NOTE — Patient Instructions (Signed)
° °Calorie Counting for Weight Loss °Calories are units of energy. Your body needs a certain amount of calories from food to keep you going throughout the day. When you eat more calories than your body needs, your body stores the extra calories as fat. When you eat fewer calories than your body needs, your body burns fat to get the energy it needs. °Calorie counting means keeping track of how many calories you eat and drink each day. Calorie counting can be helpful if you need to lose weight. If you make sure to eat fewer calories than your body needs, you should lose weight. Ask your health care provider what a healthy weight is for you. °For calorie counting to work, you will need to eat the right number of calories in a day in order to lose a healthy amount of weight per week. A dietitian can help you determine how many calories you need in a day and will give you suggestions on how to reach your calorie goal. °· A healthy amount of weight to lose per week is usually 1-2 lb (0.5-0.9 kg). This usually means that your daily calorie intake should be reduced by 500-750 calories. °· Eating 1,200 - 1,500 calories per day can help most women lose weight. °· Eating 1,500 - 1,800 calories per day can help most men lose weight. °What is my plan? °My goal is to have __________ calories per day. °If I have this many calories per day, I should lose around __________ pounds per week. °What do I need to know about calorie counting? °In order to meet your daily calorie goal, you will need to: °· Find out how many calories are in each food you would like to eat. Try to do this before you eat. °· Decide how much of the food you plan to eat. °· Write down what you ate and how many calories it had. Doing this is called keeping a food log. °To successfully lose weight, it is important to balance calorie counting with a healthy lifestyle that includes regular activity. Aim for 150 minutes of moderate exercise (such as walking) or 75  minutes of vigorous exercise (such as running) each week. °Where do I find calorie information? ° °The number of calories in a food can be found on a Nutrition Facts label. If a food does not have a Nutrition Facts label, try to look up the calories online or ask your dietitian for help. °Remember that calories are listed per serving. If you choose to have more than one serving of a food, you will have to multiply the calories per serving by the amount of servings you plan to eat. For example, the label on a package of bread might say that a serving size is 1 slice and that there are 90 calories in a serving. If you eat 1 slice, you will have eaten 90 calories. If you eat 2 slices, you will have eaten 180 calories. °How do I keep a food log? °Immediately after each meal, record the following information in your food log: °· What you ate. Don't forget to include toppings, sauces, and other extras on the food. °· How much you ate. This can be measured in cups, ounces, or number of items. °· How many calories each food and drink had. °· The total number of calories in the meal. °Keep your food log near you, such as in a small notebook in your pocket, or use a mobile app or website. Some programs will   calculate calories for you and show you how many calories you have left for the day to meet your goal. °What are some calorie counting tips? ° °· Use your calories on foods and drinks that will fill you up and not leave you hungry: °? Some examples of foods that fill you up are nuts and nut butters, vegetables, lean proteins, and high-fiber foods like whole grains. High-fiber foods are foods with more than 5 g fiber per serving. °? Drinks such as sodas, specialty coffee drinks, alcohol, and juices have a lot of calories, yet do not fill you up. °· Eat nutritious foods and avoid empty calories. Empty calories are calories you get from foods or beverages that do not have many vitamins or protein, such as candy, sweets, and  soda. It is better to have a nutritious high-calorie food (such as an avocado) than a food with few nutrients (such as a bag of chips). °· Know how many calories are in the foods you eat most often. This will help you calculate calorie counts faster. °· Pay attention to calories in drinks. Low-calorie drinks include water and unsweetened drinks. °· Pay attention to nutrition labels for "low fat" or "fat free" foods. These foods sometimes have the same amount of calories or more calories than the full fat versions. They also often have added sugar, starch, or salt, to make up for flavor that was removed with the fat. °· Find a way of tracking calories that works for you. Get creative. Try different apps or programs if writing down calories does not work for you. °What are some portion control tips? °· Know how many calories are in a serving. This will help you know how many servings of a certain food you can have. °· Use a measuring cup to measure serving sizes. You could also try weighing out portions on a kitchen scale. With time, you will be able to estimate serving sizes for some foods. °· Take some time to put servings of different foods on your favorite plates, bowls, and cups so you know what a serving looks like. °· Try not to eat straight from a bag or box. Doing this can lead to overeating. Put the amount you would like to eat in a cup or on a plate to make sure you are eating the right portion. °· Use smaller plates, glasses, and bowls to prevent overeating. °· Try not to multitask (for example, watch TV or use your computer) while eating. If it is time to eat, sit down at a table and enjoy your food. This will help you to know when you are full. It will also help you to be aware of what you are eating and how much you are eating. °What are tips for following this plan? °Reading food labels °· Check the calorie count compared to the serving size. The serving size may be smaller than what you are used to  eating. °· Check the source of the calories. Make sure the food you are eating is high in vitamins and protein and low in saturated and trans fats. °Shopping °· Read nutrition labels while you shop. This will help you make healthy decisions before you decide to purchase your food. °· Make a grocery list and stick to it. °Cooking °· Try to cook your favorite foods in a healthier way. For example, try baking instead of frying. °· Use low-fat dairy products. °Meal planning °· Use more fruits and vegetables. Half of your plate should be   fruits and vegetables. °· Include lean proteins like poultry and fish. °How do I count calories when eating out? °· Ask for smaller portion sizes. °· Consider sharing an entree and sides instead of getting your own entree. °· If you get your own entree, eat only half. Ask for a box at the beginning of your meal and put the rest of your entree in it so you are not tempted to eat it. °· If calories are listed on the menu, choose the lower calorie options. °· Choose dishes that include vegetables, fruits, whole grains, low-fat dairy products, and lean protein. °· Choose items that are boiled, broiled, grilled, or steamed. Stay away from items that are buttered, battered, fried, or served with cream sauce. Items labeled "crispy" are usually fried, unless stated otherwise. °· Choose water, low-fat milk, unsweetened iced tea, or other drinks without added sugar. If you want an alcoholic beverage, choose a lower calorie option such as a glass of wine or light beer. °· Ask for dressings, sauces, and syrups on the side. These are usually high in calories, so you should limit the amount you eat. °· If you want a salad, choose a garden salad and ask for grilled meats. Avoid extra toppings like bacon, cheese, or fried items. Ask for the dressing on the side, or ask for olive oil and vinegar or lemon to use as dressing. °· Estimate how many servings of a food you are given. For example, a serving of  cooked rice is ½ cup or about the size of half a baseball. Knowing serving sizes will help you be aware of how much food you are eating at restaurants. The list below tells you how big or small some common portion sizes are based on everyday objects: °? 1 oz--4 stacked dice. °? 3 oz--1 deck of cards. °? 1 tsp--1 die. °? 1 Tbsp--½ a ping-pong ball. °? 2 Tbsp--1 ping-pong ball. °? ½ cup--½ baseball. °? 1 cup--1 baseball. °Summary °· Calorie counting means keeping track of how many calories you eat and drink each day. If you eat fewer calories than your body needs, you should lose weight. °· A healthy amount of weight to lose per week is usually 1-2 lb (0.5-0.9 kg). This usually means reducing your daily calorie intake by 500-750 calories. °· The number of calories in a food can be found on a Nutrition Facts label. If a food does not have a Nutrition Facts label, try to look up the calories online or ask your dietitian for help. °· Use your calories on foods and drinks that will fill you up, and not on foods and drinks that will leave you hungry. °· Use smaller plates, glasses, and bowls to prevent overeating. °This information is not intended to replace advice given to you by your health care provider. Make sure you discuss any questions you have with your health care provider. °Document Released: 09/30/2005 Document Revised: 06/19/2018 Document Reviewed: 08/30/2016 °Elsevier Patient Education © 2020 [employer] ° °

## 2019-09-13 NOTE — Progress Notes (Signed)
SUBJECTIVE:  73 y.o. here for follow-up weight loss visit, previously seen 4 weeks ago. Denies any concerns and feels like medication is working. Discussed with pt recommendations for use of medication a few weeks up to a year if responding well to medications. Pt has been on medication since 07/2018 with a total weight loss of 24 lbs. Discussed potential risks and I do not recommend continuation of medication. Hand out given to pt.   OBJECTIVE:  BP 127/81   Pulse 97   Ht 5\' 3"  (1.6 m)   Wt 254 lb 1 oz (115.2 kg)   BMI 45.01 kg/m   Body mass index is 45.01 kg/m. Patient appears well. ASSESSMENT:  Obesity- responding well to weight loss plan PLAN:  Offered referral for nutrition counseling and or referral to weight  loss specialist. She declines. At this time.    Philip Aspen, CNM

## 2019-10-29 ENCOUNTER — Other Ambulatory Visit: Payer: Self-pay | Admitting: Family Medicine

## 2019-10-29 DIAGNOSIS — N6320 Unspecified lump in the left breast, unspecified quadrant: Secondary | ICD-10-CM

## 2019-11-09 ENCOUNTER — Ambulatory Visit
Admission: RE | Admit: 2019-11-09 | Discharge: 2019-11-09 | Disposition: A | Discharge status: Home / Self Care | Payer: Medicare HMO | Source: Ambulatory Visit | Attending: Family Medicine | Admitting: Family Medicine

## 2019-11-09 DIAGNOSIS — N6320 Unspecified lump in the left breast, unspecified quadrant: Secondary | ICD-10-CM

## 2019-11-09 DIAGNOSIS — N6321 Unspecified lump in the left breast, upper outer quadrant: Secondary | ICD-10-CM | POA: Insufficient documentation

## 2020-07-25 ENCOUNTER — Other Ambulatory Visit: Payer: Self-pay | Admitting: Family Medicine

## 2020-07-25 DIAGNOSIS — M810 Age-related osteoporosis without current pathological fracture: Secondary | ICD-10-CM

## 2021-06-14 ENCOUNTER — Other Ambulatory Visit: Payer: Self-pay | Admitting: Family Medicine

## 2021-06-14 DIAGNOSIS — Z1231 Encounter for screening mammogram for malignant neoplasm of breast: Secondary | ICD-10-CM

## 2021-06-14 DIAGNOSIS — M810 Age-related osteoporosis without current pathological fracture: Secondary | ICD-10-CM

## 2022-02-25 ENCOUNTER — Other Ambulatory Visit: Payer: Self-pay | Admitting: Family Medicine

## 2022-02-25 DIAGNOSIS — Z780 Asymptomatic menopausal state: Secondary | ICD-10-CM

## 2022-02-25 DIAGNOSIS — Z1231 Encounter for screening mammogram for malignant neoplasm of breast: Secondary | ICD-10-CM

## 2022-04-23 ENCOUNTER — Ambulatory Visit
Admission: RE | Admit: 2022-04-23 | Discharge: 2022-04-23 | Disposition: A | Discharge status: Home / Self Care | Payer: Medicare HMO | Source: Ambulatory Visit | Attending: Family Medicine | Admitting: Family Medicine

## 2022-04-23 DIAGNOSIS — Z780 Asymptomatic menopausal state: Secondary | ICD-10-CM | POA: Insufficient documentation

## 2022-04-23 DIAGNOSIS — Z1231 Encounter for screening mammogram for malignant neoplasm of breast: Secondary | ICD-10-CM

## 2023-04-07 ENCOUNTER — Other Ambulatory Visit: Payer: Self-pay | Admitting: Family Medicine

## 2023-04-07 DIAGNOSIS — Z1231 Encounter for screening mammogram for malignant neoplasm of breast: Secondary | ICD-10-CM

## 2023-04-29 ENCOUNTER — Ambulatory Visit
Admission: RE | Admit: 2023-04-29 | Discharge: 2023-04-29 | Disposition: A | Discharge status: Home / Self Care | Payer: Medicare HMO | Source: Ambulatory Visit | Attending: Family Medicine | Admitting: Family Medicine

## 2023-04-29 DIAGNOSIS — Z1231 Encounter for screening mammogram for malignant neoplasm of breast: Secondary | ICD-10-CM | POA: Diagnosis present

## 2023-06-06 LAB — COLOGUARD: COLOGUARD: POSITIVE — AB

## 2023-06-11 ENCOUNTER — Emergency Department
Admission: EM | Admit: 2023-06-11 | Discharge: 2023-06-11 | Disposition: A | Discharge status: Home / Self Care | Payer: Medicare HMO | Attending: Emergency Medicine | Admitting: Emergency Medicine

## 2023-06-11 ENCOUNTER — Encounter: Payer: Self-pay | Admitting: Medical Oncology

## 2023-06-11 ENCOUNTER — Other Ambulatory Visit: Payer: Self-pay

## 2023-06-11 ENCOUNTER — Emergency Department: Payer: Medicare HMO

## 2023-06-11 DIAGNOSIS — I10 Essential (primary) hypertension: Secondary | ICD-10-CM | POA: Diagnosis not present

## 2023-06-11 DIAGNOSIS — R079 Chest pain, unspecified: Secondary | ICD-10-CM | POA: Insufficient documentation

## 2023-06-11 LAB — CBC
HCT: 40.9 % (ref 36.0–46.0)
Hemoglobin: 13.8 g/dL (ref 12.0–15.0)
MCH: 30.3 pg (ref 26.0–34.0)
MCHC: 33.7 g/dL (ref 30.0–36.0)
MCV: 89.7 fL (ref 80.0–100.0)
Platelets: 275 10*3/uL (ref 150–400)
RBC: 4.56 MIL/uL (ref 3.87–5.11)
RDW: 14.5 % (ref 11.5–15.5)
WBC: 7 10*3/uL (ref 4.0–10.5)
nRBC: 0 % (ref 0.0–0.2)

## 2023-06-11 LAB — BASIC METABOLIC PANEL
Anion gap: 6 (ref 5–15)
BUN: 17 mg/dL (ref 8–23)
CO2: 21 mmol/L — ABNORMAL LOW (ref 22–32)
Calcium: 9.1 mg/dL (ref 8.9–10.3)
Chloride: 108 mmol/L (ref 98–111)
Creatinine, Ser: 1.01 mg/dL — ABNORMAL HIGH (ref 0.44–1.00)
GFR, Estimated: 58 mL/min — ABNORMAL LOW (ref >60)
Glucose, Bld: 108 mg/dL — ABNORMAL HIGH (ref 70–99)
Potassium: 4.6 mmol/L (ref 3.5–5.1)
Sodium: 135 mmol/L (ref 135–145)

## 2023-06-11 LAB — TROPONIN I (HIGH SENSITIVITY): Troponin I (High Sensitivity): <2 ng/L (ref <18)

## 2023-06-11 MED ORDER — ALUM & MAG HYDROXIDE-SIMETH 200-200-20 MG/5ML PO SUSP
30.0000 mL | Freq: Once | ORAL | Status: AC
  Administered 2023-06-11: 30 mL via ORAL
  Filled 2023-06-11: qty 30

## 2023-06-11 MED ORDER — LIDOCAINE VISCOUS HCL 2 % MT SOLN
15.0000 mL | Freq: Once | OROMUCOSAL | Status: AC
  Administered 2023-06-11: 15 mL via ORAL
  Filled 2023-06-11: qty 15

## 2023-06-11 MED ORDER — SUCRALFATE 1 G PO TABS: 1.0000 g | ORAL_TABLET | Freq: Four times a day (QID) | ORAL | 0 refills | Status: DC

## 2023-06-11 NOTE — Discharge Instructions (Addendum)
Please call the number provided for GI medicine to arrange a follow-up appointment.  Please take your Carafate 4 times daily as prescribed for the next 2 weeks in addition to your omeprazole that you are taking every morning.  You may also use over-the-counter liquid Maalox for any discomfort.  Return to the emergency department for any worsening pain any trouble breathing or any other symptom partially concerning to yourself.

## 2023-06-11 NOTE — ED Triage Notes (Signed)
Pt ambulatory to triage with reports that she has been having central chest pressure x 1 week. Feels like "water can't go down". Pt reports occasional SOB.

## 2023-06-11 NOTE — ED Provider Notes (Signed)
[facility] Provider Note    Event Date/Time   First MD Initiated Contact with Patient 06/11/23 1133     (approximate)  History   Chief Complaint: Chest Pain  HPI  Jillian Mccullough is a 77 y.o. female with a past medical history of hypertension, hyperlipidemia, gastric reflux, presents to the emergency department for intermittent chest pain.  According to the patient with a past couple weeks she has intermittently been experiencing a burning sensation at times in her chest.  Patient states also at times she will have pain if she swallows food or liquid.  Patient states she took an extra omeprazole today but the pain persisted so she came to the emergency department.  Patient denies any cardiac history.  Denies any stents.  No shortness of breath nausea or diaphoresis.  Physical Exam   Triage Vital Signs: ED Triage Vitals  Encounter Vitals Group     BP 06/11/23 1055 128/66     Systolic BP Percentile --      Diastolic BP Percentile --      Pulse Rate 06/11/23 1055 94     Resp 06/11/23 1055 18     Temp 06/11/23 1055 98.8 F (37.1 C)     Temp Source 06/11/23 1055 Oral     SpO2 06/11/23 1055 95 %     Weight 06/11/23 1047 280 lb (127 kg)     Height 06/11/23 1047 5\' 3"  (1.6 m)     Head Circumference --      Peak Flow --      Pain Score 06/11/23 1047 5     Pain Loc --      Pain Education --      Exclude from Growth Chart --     Most recent vital signs: Vitals:   06/11/23 1055  BP: 128/66  Pulse: 94  Resp: 18  Temp: 98.8 F (37.1 C)  SpO2: 95%    General: Awake, no distress.  CV:  Good peripheral perfusion.  Regular rate and rhythm  Resp:  Normal effort.  Equal breath sounds bilaterally.  Abd:  No distention.  Soft, nontender.  No rebound or guarding.  ED Results / Procedures / Treatments   EKG  EKG viewed and interpreted by myself shows a normal sinus rhythm at 96 bpm with a narrow QRS, normal axis, normal intervals, nonspecific ST  changes.  RADIOLOGY  I have reviewed and interpreted chest x-ray images, hypoventilation but no obvious consolidation. Radiology is read the x-ray is negative   MEDICATIONS ORDERED IN ED: Medications  alum & mag hydroxide-simeth (MAALOX/MYLANTA) 200-200-20 MG/5ML suspension 30 mL (30 mLs Oral Given 06/11/23 1154)    And  lidocaine (XYLOCAINE) 2 % viscous mouth solution 15 mL (15 mLs Oral Given 06/11/23 1154)     IMPRESSION / MDM / ASSESSMENT AND PLAN / ED COURSE  I reviewed the triage vital signs and the nursing notes.  Patient's presentation is most consistent with acute presentation with potential threat to life or bodily function.  Patient presents to the emergency department for chest pain which has been intermittent.  Patient described as a burning sensation does have a history of gastric reflux for which she takes omeprazole.  Patient denies any cardiac history.  Symptoms sound suggestive of possible esophagitis or gastritis given the patient's discomfort with swallowing at times.  However given the patient's age we will obtain lab work including CBC chemistry troponin and obtain a chest x-ray and EKG.  We will  dose a GI cocktail monitor for improvement.  Patient's labs have resulted reassuring with a normal chemistry, normal CBC and a negative troponin, EKG shows no significant findings and chest x-ray is clear.    FINAL CLINICAL IMPRESSION(S) / ED DIAGNOSES   Chest pain    Note:  This document was prepared using Dragon voice recognition software and may include unintentional dictation errors.   Minna Antis, MD 06/11/23 1302

## 2024-11-19 ENCOUNTER — Encounter: Payer: Self-pay | Admitting: Nurse Practitioner

## 2024-11-19 ENCOUNTER — Ambulatory Visit: Admitting: Nurse Practitioner

## 2024-11-19 VITALS — BP 122/66 | HR 90 | Temp 98.2°F | Ht 63.0 in | Wt 285.4 lb

## 2024-11-19 DIAGNOSIS — N3946 Mixed incontinence: Secondary | ICD-10-CM | POA: Insufficient documentation

## 2024-11-19 DIAGNOSIS — E785 Hyperlipidemia, unspecified: Secondary | ICD-10-CM

## 2024-11-19 DIAGNOSIS — Z1329 Encounter for screening for other suspected endocrine disorder: Secondary | ICD-10-CM

## 2024-11-19 DIAGNOSIS — M810 Age-related osteoporosis without current pathological fracture: Secondary | ICD-10-CM | POA: Insufficient documentation

## 2024-11-19 DIAGNOSIS — Z1231 Encounter for screening mammogram for malignant neoplasm of breast: Secondary | ICD-10-CM

## 2024-11-19 DIAGNOSIS — I10 Essential (primary) hypertension: Secondary | ICD-10-CM

## 2024-11-19 DIAGNOSIS — G8929 Other chronic pain: Secondary | ICD-10-CM | POA: Insufficient documentation

## 2024-11-19 DIAGNOSIS — R0609 Other forms of dyspnea: Secondary | ICD-10-CM

## 2024-11-19 DIAGNOSIS — K219 Gastro-esophageal reflux disease without esophagitis: Secondary | ICD-10-CM | POA: Insufficient documentation

## 2024-11-19 LAB — HEMOGLOBIN A1C: Hgb A1c MFr Bld: 6.7 % — ABNORMAL HIGH (ref 4.6–6.5)

## 2024-11-19 LAB — CBC WITH DIFFERENTIAL/PLATELET
Basophils Absolute: 0 10*3/uL (ref 0.0–0.1)
Basophils Relative: 0.6 % (ref 0.0–3.0)
Eosinophils Absolute: 0 10*3/uL (ref 0.0–0.7)
Eosinophils Relative: 0.8 % (ref 0.0–5.0)
HCT: 39 % (ref 36.0–46.0)
Hemoglobin: 13.1 g/dL (ref 12.0–15.0)
Lymphocytes Relative: 28.7 % (ref 12.0–46.0)
Lymphs Abs: 1.7 10*3/uL (ref 0.7–4.0)
MCHC: 33.5 g/dL (ref 30.0–36.0)
MCV: 89.7 fl (ref 78.0–100.0)
Monocytes Absolute: 0.5 10*3/uL (ref 0.1–1.0)
Monocytes Relative: 7.9 % (ref 3.0–12.0)
Neutro Abs: 3.6 10*3/uL (ref 1.4–7.7)
Neutrophils Relative %: 62 % (ref 43.0–77.0)
Platelets: 273 10*3/uL (ref 150.0–400.0)
RBC: 4.35 Mil/uL (ref 3.87–5.11)
RDW: 14.9 % (ref 11.5–15.5)
WBC: 5.8 10*3/uL (ref 4.0–10.5)

## 2024-11-19 LAB — COMPREHENSIVE METABOLIC PANEL WITH GFR
ALT: 11 U/L (ref 3–35)
AST: 15 U/L (ref 5–37)
Albumin: 4.4 g/dL (ref 3.5–5.2)
Alkaline Phosphatase: 50 U/L (ref 39–117)
BUN: 20 mg/dL (ref 6–23)
CO2: 24 meq/L (ref 19–32)
Calcium: 10.4 mg/dL (ref 8.4–10.5)
Chloride: 105 meq/L (ref 96–112)
Creatinine, Ser: 1.07 mg/dL (ref 0.40–1.20)
GFR: 50.59 mL/min — ABNORMAL LOW
Glucose, Bld: 98 mg/dL (ref 70–99)
Potassium: 4.3 meq/L (ref 3.5–5.1)
Sodium: 140 meq/L (ref 135–145)
Total Bilirubin: 0.6 mg/dL (ref 0.2–1.2)
Total Protein: 7.7 g/dL (ref 6.0–8.3)

## 2024-11-19 LAB — VITAMIN D 25 HYDROXY (VIT D DEFICIENCY, FRACTURES): VITD: 34.13 ng/mL (ref 30.00–100.00)

## 2024-11-19 LAB — LIPID PANEL
Cholesterol: 144 mg/dL (ref 28–200)
HDL: 51.9 mg/dL
LDL Cholesterol: 68 mg/dL (ref 10–99)
NonHDL: 92.21
Total CHOL/HDL Ratio: 3
Triglycerides: 122 mg/dL (ref 10.0–149.0)
VLDL: 24.4 mg/dL (ref 0.0–40.0)

## 2024-11-19 LAB — TSH: TSH: 0.85 u[IU]/mL (ref 0.35–5.50)

## 2024-11-19 MED ORDER — SPIRONOLACTONE 25 MG PO TABS: 25.0000 mg | ORAL_TABLET | Freq: Every morning | ORAL | 3 refills | Status: AC

## 2024-11-19 MED ORDER — LISINOPRIL 40 MG PO TABS: 40.0000 mg | ORAL_TABLET | Freq: Every day | ORAL | 3 refills | Status: AC

## 2024-11-19 MED ORDER — AMLODIPINE BESYLATE 5 MG PO TABS: 5.0000 mg | ORAL_TABLET | Freq: Every day | ORAL | 3 refills | Status: AC

## 2024-11-19 MED ORDER — ALENDRONATE SODIUM 70 MG PO TABS: 70.0000 mg | ORAL_TABLET | ORAL | 3 refills | Status: AC

## 2024-11-19 MED ORDER — GABAPENTIN 100 MG PO CAPS: 100.0000 mg | ORAL_CAPSULE | Freq: Every day | ORAL | 5 refills | Status: AC

## 2024-11-19 MED ORDER — ALBUTEROL SULFATE HFA 108 (90 BASE) MCG/ACT IN AERS: 1.0000 | INHALATION_SPRAY | Freq: Four times a day (QID) | RESPIRATORY_TRACT | 5 refills | Status: AC | PRN

## 2024-11-19 MED ORDER — CELECOXIB 100 MG PO CAPS: 100.0000 mg | ORAL_CAPSULE | Freq: Two times a day (BID) | ORAL | 3 refills | Status: AC

## 2024-11-19 MED ORDER — ATORVASTATIN CALCIUM 20 MG PO TABS: 20.0000 mg | ORAL_TABLET | Freq: Every day | ORAL | 3 refills | Status: AC

## 2024-11-19 MED ORDER — OMEPRAZOLE 40 MG PO CPDR: 40.0000 mg | DELAYED_RELEASE_CAPSULE | Freq: Every day | ORAL | 3 refills | Status: AC

## 2024-11-19 NOTE — Progress Notes (Signed)
 " Leron Glance, NP-C Phone: [phone]  Discussed the use of AI scribe software for clinical note transcription with the patient, who gave verbal consent to proceed.  History of Present Illness   Jillian Mccullough is a 79 year old female with hypertension and hyperlipidemia who presents to establish care and for evaluation of chronic back and leg pain. She is accompanied by her twin sister.  She experiences chronic back and leg pain, described as burning and radiating from her knee to her hip, particularly affecting her right leg and lower back. The pain is worse at night, often waking her up, and she uses Vicks vapor rub for temporary relief. She has not undergone any prior treatments or physical therapy for this condition. The pain has been ongoing for a long time and impacts her mobility, causing her to stumble. She uses a cane for support and experiences increased pain when standing for extended periods.  Her hypertension is managed with lisinopril , amlodipine , and spironolactone . She monitors her blood pressure at home, typically recording readings around 130/80 mmHg. She notes occasional swelling in her legs.  Her hyperlipidemia is managed with atorvastatin . She denies muscle aches or abdominal pain associated with this medication.  She has osteoporosis and takes Fosamax  once a week. She also takes vitamin D and other supplements. She last had a bone density test two years ago.  She experiences acid reflux, managed with omeprazole  once daily, though she sometimes requires an additional dose if she consumes spicy foods. Symptoms include heartburn and an acid taste in her mouth, with occasional difficulty swallowing.  She has a history of smoking but quit two years ago. She uses albuterol  as needed, particularly at night when she experiences shortness of breath while lying down.  She reports urinary incontinence, particularly stress incontinence, and wakes frequently at night to urinate. She  drinks a lot of water throughout the day. No significant mood issues, anxiety, or depression, though she acknowledges some difficulty sleeping due to frequent urination.       Active Ambulatory Problems    Diagnosis Date Noted   Hypertension 02/18/2012   Hyperlipidemia with target low density lipoprotein (LDL) cholesterol less than 100 mg/dL 94/92/7986   Age-related osteoporosis without current pathological fracture 11/19/2024   Gastroesophageal reflux disease 11/19/2024   Chronic bilateral low back pain with right-sided sciatica 11/19/2024   Morbid obesity with BMI of 50.0-59.9, adult (HCC) 11/19/2024   Mixed incontinence urge and stress 11/19/2024   Resolved Ambulatory Problems    Diagnosis Date Noted   CHEST PAIN-UNSPECIFIED 03/28/2010   Constipation 02/18/2012   Dizziness 02/18/2012   Urine abnormality 06/23/2012   Pancreatitis 06/23/2012   Past Medical History:  Diagnosis Date   Chicken pox    GERD (gastroesophageal reflux disease)    Hyperlipidemia    Kidney stones    Migraines     Family History  Problem Relation Age of Onset   Hypertension Mother    Cancer Mother        colon and lung   Colon cancer Father    Diabetes Father    Hyperlipidemia Sister    Diabetes Sister    Hyperlipidemia Brother    Arthritis Brother    Breast cancer Sister 67   Arthritis Sister     Social History   Socioeconomic History   Marital status: Divorced    Spouse name: Not on file   Number of children: Not on file   Years of education: Not on file   Highest  education level: Not on file  Occupational History   Occupation: Part time  Tobacco Use   Smoking status: Every Day    Current packs/day: 0.50    Average packs/day: 0.5 packs/day for 36.0 years (18.0 ttl pk-yrs)    Types: Cigarettes   Smokeless tobacco: Never  Vaping Use   Vaping status: Never Used  Substance and Sexual Activity   Alcohol use: No    Alcohol/week: 0.0 standard drinks of alcohol   Drug use: No    Sexual activity: Not on file  Other Topics Concern   Not on file  Social History Narrative   Lives in [place] with son. Dog.   Work: retired, Administrator, [employer]   Diet: regular   Exercise: none recently   Social Drivers of Health   Tobacco Use: High Risk (11/19/2024)   Patient History    Smoking Tobacco Use: Every Day    Smokeless Tobacco Use: Never    Passive Exposure: Not on file  Financial Resource Strain: Not on file  Food Insecurity: Not on file  Transportation Needs: Not on file  Physical Activity: Not on file  Stress: Not on file  Social Connections: Not on file  Intimate Partner Violence: Not on file  Depression (PHQ2-9): Not on file  Alcohol Screen: Not on file  Housing: Not on file  Utilities: Not on file  Health Literacy: Not on file    ROS  General:  Negative for unexplained weight loss, fever Skin: Negative for new or changing mole, sore that won't heal HEENT: Negative for trouble hearing, trouble seeing, ringing in ears, mouth sores, hoarseness, change in voice, dysphagia. CV:  Negative for chest pain, dyspnea, edema, palpitations Resp: Negative for cough, hemoptysis GI: Negative for nausea, vomiting, diarrhea, constipation, abdominal pain, melena, hematochezia. GU: Negative for dysuria, urinary hesitance, hematuria, vaginal or penile discharge, polyuria, sexual difficulty, lumps in testicle or breasts MSK: Negative for muscle cramps or swelling Neuro: Negative for headaches, weakness, numbness, dizziness, passing out/fainting Psych: Negative for depression, anxiety, memory problems  Objective  Physical Exam Vitals:   11/19/24 0951  BP: 122/66  Pulse: 90  Temp: 98.2 F (36.8 C)  SpO2: 97%    BP Readings from Last 3 Encounters:  11/19/24 122/66  06/11/23 110/86  09/13/19 127/81   Wt Readings from Last 3 Encounters:  11/19/24 285 lb 6.4 oz (129.5 kg)  06/11/23 280 lb (127 kg)  09/13/19 254 lb 1 oz (115.2 kg)    Physical Exam Constitutional:       General: She is not in acute distress.    Appearance: Normal appearance.  HENT:     Head: Normocephalic.  Neck:     Thyroid: No thyromegaly.  Cardiovascular:     Rate and Rhythm: Normal rate and regular rhythm.     Heart sounds: Normal heart sounds.  Pulmonary:     Effort: Pulmonary effort is normal.     Breath sounds: Normal breath sounds.  Abdominal:     General: Abdomen is flat. Bowel sounds are normal.     Palpations: Abdomen is soft. There is no mass.     Tenderness: There is no abdominal tenderness.  Musculoskeletal:     Lumbar back: Tenderness present. Decreased range of motion.  Lymphadenopathy:     Cervical: No cervical adenopathy.  Skin:    General: Skin is warm and dry.     Findings: No rash.  Neurological:     General: No focal deficit present.     Mental Status:  She is alert.  Psychiatric:        Mood and Affect: Mood normal.        Behavior: Behavior normal.      Assessment/Plan:   Chronic bilateral low back pain with right-sided sciatica Assessment & Plan: Chronic low back pain with right-sided sciatica is likely due to degenerative disc disease and arthritis. An x-ray of the lumbar spine is ordered for further evaluation. Restart Celebrex  100 mg twice daily as needed for its anti-inflammatory effect and gabapentin  100 mg at bedtime, increasing to 200 mg if needed. Counseled on common side effects. We will continue to monitor.   Orders: -     Celecoxib ; Take 1 capsule (100 mg total) by mouth 2 (two) times daily.  Dispense: 180 capsule; Refill: 3 -     Gabapentin ; Take 1 capsule (100 mg total) by mouth at bedtime. X 7 days then can increase to 2 capsules at bedtime if needed.  Dispense: 60 capsule; Refill: 5 -     DG Lumbar Spine 2-3 Views; Future  Gastroesophageal reflux disease, unspecified whether esophagitis present Assessment & Plan: GERD is managed with omeprazole . Symptoms include heartburn and acid taste, exacerbated by spicy foods. Increase  omeprazole  to 40 mg daily. Encourage dietary modifications to avoid triggers. We will continue to monitor.   Orders: -     Omeprazole ; Take 1 capsule (40 mg total) by mouth daily.  Dispense: 90 capsule; Refill: 3  Primary hypertension Assessment & Plan: Blood pressure is well controlled on Amlodipine  5 mg daily, Lisinopril  40 mg daily and Spironolactone  25 mg daily. Continue current medication regimen. Continue home monitoring. Check CMP.   Orders: -     CBC with Differential/Platelet -     Comprehensive metabolic panel with GFR -     amLODIPine  Besylate; Take 1 tablet (5 mg total) by mouth daily.  Dispense: 90 tablet; Refill: 3 -     Lisinopril ; Take 1 tablet (40 mg total) by mouth daily.  Dispense: 90 tablet; Refill: 3 -     Spironolactone ; Take 1 tablet (25 mg total) by mouth every morning.  Dispense: 90 tablet; Refill: 3  Hyperlipidemia with target low density lipoprotein (LDL) cholesterol less than 100 mg/dL Assessment & Plan: Managed with atorvastatin  20 mg daily. Continue. Check lipid panel.   Orders: -     Lipid panel -     Atorvastatin  Calcium ; Take 1 tablet (20 mg total) by mouth daily.  Dispense: 90 tablet; Refill: 3  Age-related osteoporosis without current pathological fracture Assessment & Plan: Osteoporosis is managed with Fosamax . The last bone density test was two years ago. Continue Fosamax  once weekly and vitamin D supplementation. Encourage weight bearing exercises. Check vitamin D level.   Orders: -     VITAMIN D 25 Hydroxy (Vit-D Deficiency, Fractures) -     Alendronate  Sodium; Take 1 tablet (70 mg total) by mouth once a week.  Dispense: 12 tablet; Refill: 3  Mixed incontinence urge and stress Assessment & Plan: She experiences stress and urge components, likely related to childbirth and weak pelvic floor. Implement fluid restriction before bedtime and schedule bathroom visits every 1-2 hours. She would like to avoid medication at this time. We will continue to  monitor.    Morbid obesity with BMI of 50.0-59.9, adult [facility]) Assessment & Plan: Check A1c. Encourage healthy diet and activity as tolerated.   Orders: -     Hemoglobin A1c  DOE (dyspnea on exertion) -     Albuterol  Sulfate  HFA; Inhale 1-2 puffs into the lungs every 6 (six) hours as needed for wheezing or shortness of breath.  Dispense: 18 g; Refill: 5  Screening mammogram for breast cancer -     3D Screening Mammogram, Left and Right; Future  Thyroid disorder screen -     TSH     Return in about 6 weeks (around 12/31/2024) for Follow up.   Leron Glance, NP-C [facility] "

## 2024-11-19 NOTE — Assessment & Plan Note (Signed)
 Check A1c. Encourage healthy diet and activity as tolerated.

## 2024-11-19 NOTE — Assessment & Plan Note (Signed)
 Chronic low back pain with right-sided sciatica is likely due to degenerative disc disease and arthritis. An x-ray of the lumbar spine is ordered for further evaluation. Restart Celebrex  100 mg twice daily as needed for its anti-inflammatory effect and gabapentin  100 mg at bedtime, increasing to 200 mg if needed. Counseled on common side effects. We will continue to monitor.

## 2024-11-19 NOTE — Assessment & Plan Note (Signed)
 Blood pressure is well controlled on Amlodipine  5 mg daily, Lisinopril  40 mg daily and Spironolactone  25 mg daily. Continue current medication regimen. Continue home monitoring. Check CMP.

## 2024-11-19 NOTE — Patient Instructions (Signed)
 YOUR MAMMOGRAM IS DUE, PLEASE CALL AND GET THIS SCHEDULED! [facility] - call [phone]

## 2024-11-19 NOTE — Assessment & Plan Note (Signed)
 She experiences stress and urge components, likely related to childbirth and weak pelvic floor. Implement fluid restriction before bedtime and schedule bathroom visits every 1-2 hours. She would like to avoid medication at this time. We will continue to monitor.

## 2024-11-19 NOTE — Assessment & Plan Note (Signed)
 GERD is managed with omeprazole . Symptoms include heartburn and acid taste, exacerbated by spicy foods. Increase omeprazole  to 40 mg daily. Encourage dietary modifications to avoid triggers. We will continue to monitor.

## 2024-11-19 NOTE — Assessment & Plan Note (Signed)
 Managed with atorvastatin  20 mg daily. Continue. Check lipid panel.

## 2024-11-19 NOTE — Assessment & Plan Note (Addendum)
 Osteoporosis is managed with Fosamax . The last bone density test was two years ago. Continue Fosamax  once weekly and vitamin D supplementation. Encourage weight bearing exercises. Check vitamin D level.

## 2024-12-10 ENCOUNTER — Encounter

## 2025-01-12 ENCOUNTER — Ambulatory Visit: Admitting: Nurse Practitioner
